# Patient Record
Sex: Male | Born: 1970 | Race: White | Hispanic: No | Marital: Married | Smoking: Never smoker
Health system: Southern US, Community
[De-identification: ages and names within clinical notes are randomized; demographics above are authoritative.]

## PROBLEM LIST (undated history)

## (undated) DIAGNOSIS — M549 Dorsalgia, unspecified: Secondary | ICD-10-CM

## (undated) HISTORY — PX: KNEE SURGERY: SHX244

---

## 2004-05-15 ENCOUNTER — Ambulatory Visit (HOSPITAL_COMMUNITY): Admission: RE | Admit: 2004-05-15 | Discharge: 2004-05-15 | Payer: Self-pay | Admitting: Urology

## 2004-05-16 ENCOUNTER — Ambulatory Visit (HOSPITAL_COMMUNITY): Admission: RE | Admit: 2004-05-16 | Discharge: 2004-05-16 | Payer: Self-pay | Admitting: Urology

## 2011-08-26 ENCOUNTER — Other Ambulatory Visit (HOSPITAL_COMMUNITY): Payer: Self-pay | Admitting: Urology

## 2011-08-26 DIAGNOSIS — N200 Calculus of kidney: Secondary | ICD-10-CM

## 2011-08-28 ENCOUNTER — Encounter (HOSPITAL_COMMUNITY): Payer: Self-pay

## 2011-08-28 ENCOUNTER — Ambulatory Visit (HOSPITAL_COMMUNITY)
Admission: RE | Admit: 2011-08-28 | Discharge: 2011-08-28 | Disposition: A | Discharge status: Home / Self Care | Payer: BC Managed Care – PPO | Source: Ambulatory Visit | Attending: Urology | Admitting: Urology

## 2011-08-28 DIAGNOSIS — N201 Calculus of ureter: Secondary | ICD-10-CM | POA: Insufficient documentation

## 2011-08-28 DIAGNOSIS — R1032 Left lower quadrant pain: Secondary | ICD-10-CM | POA: Insufficient documentation

## 2011-08-28 DIAGNOSIS — N200 Calculus of kidney: Secondary | ICD-10-CM

## 2014-04-15 ENCOUNTER — Encounter (HOSPITAL_COMMUNITY): Payer: Self-pay | Admitting: Emergency Medicine

## 2014-04-15 ENCOUNTER — Emergency Department (HOSPITAL_COMMUNITY)
Admission: EM | Admit: 2014-04-15 | Discharge: 2014-04-15 | Disposition: A | Discharge status: Home / Self Care | Payer: BC Managed Care – PPO | Attending: Emergency Medicine | Admitting: Emergency Medicine

## 2014-04-15 DIAGNOSIS — L259 Unspecified contact dermatitis, unspecified cause: Secondary | ICD-10-CM | POA: Insufficient documentation

## 2014-04-15 DIAGNOSIS — L239 Allergic contact dermatitis, unspecified cause: Secondary | ICD-10-CM

## 2014-04-15 DIAGNOSIS — J029 Acute pharyngitis, unspecified: Secondary | ICD-10-CM | POA: Insufficient documentation

## 2014-04-15 DIAGNOSIS — R110 Nausea: Secondary | ICD-10-CM | POA: Insufficient documentation

## 2014-04-15 HISTORY — DX: Dorsalgia, unspecified: M54.9

## 2014-04-15 MED ORDER — HYDROXYZINE HCL 25 MG PO TABS: 25.0000 mg | ORAL_TABLET | Freq: Four times a day (QID) | ORAL | Status: DC

## 2014-04-15 MED ORDER — FAMOTIDINE 20 MG PO TABS: 20.0000 mg | ORAL_TABLET | Freq: Two times a day (BID) | ORAL | Status: DC

## 2014-04-15 NOTE — ED Provider Notes (Signed)
CSN: [id number]     Arrival date & time 04/15/14  1220 History   First MD Initiated Contact with Patient 04/15/14 1240     Chief Complaint  Patient presents with  . Rash     (Consider location/radiation/quality/duration/timing/severity/associated sxs/prior Treatment) Patient is a 43 y.o. male presenting with rash. The history is provided by the patient.  Rash Location:  Full body Quality: itchiness   Severity:  Moderate Onset quality:  Gradual Duration:  1 week Timing:  Constant Progression:  Worsening Chronicity:  New Relieved by:  Nothing Worsened by:  Heat Ineffective treatments:  Antihistamines and anti-itch cream (prednisone) Associated symptoms: nausea, sore throat and URI   Associated symptoms: no abdominal pain, no diarrhea, no fever, no headaches, no myalgias, no periorbital edema, no throat swelling and not vomiting    Travis Mendez is a 43 y.o. male who presents to the ED with a rash that started about a week ago. He has never had anything like this before. He went to Urgent Care yesterday and was treated with prednisone and Zyrtec. Today the rash seems worse rather than better.   Past Medical History  Diagnosis Date  . Back pain    Past Surgical History  Procedure Laterality Date  . Knee surgery     No family history on file. History  Substance Use Topics  . Smoking status: Never Smoker   . Smokeless tobacco: Not on file  . Alcohol Use: Yes    Review of Systems  Constitutional: Negative for fever and chills.  HENT: Positive for sore throat.   Eyes: Negative for redness.  Respiratory: Positive for cough.   Cardiovascular: Negative for chest pain.  Gastrointestinal: Positive for nausea. Negative for vomiting, abdominal pain and diarrhea.  Genitourinary: Negative.   Musculoskeletal: Negative for myalgias. Back pain: chronic.  Skin: Positive for rash.  Neurological: Negative for headaches.  Psychiatric/Behavioral: Negative for confusion. The patient  is not nervous/anxious.       Allergies  Review of patient's allergies indicates not on file.  Home Medications   Prior to Admission medications   Not on File   BP 137/82  Pulse 84  Temp(Src) 97.9 F (36.6 C) (Oral)  Resp 16  Ht 6\' 1"  (1.854 m)  Wt 220 lb (99.791 kg)  BMI 29.03 kg/m2  SpO2 99% Physical Exam  Nursing note and vitals reviewed. Constitutional: He is oriented to person, place, and time. He appears well-developed and well-nourished. No distress.  HENT:  Head: Normocephalic and atraumatic.  Mouth/Throat: Uvula is midline, oropharynx is clear and moist and mucous membranes are normal.  Eyes: Conjunctivae and EOM are normal. Pupils are equal, round, and reactive to light.  Neck: Neck supple.  Cardiovascular: Normal rate, regular rhythm and normal heart sounds.   Pulmonary/Chest: Effort normal. No respiratory distress. He has no wheezes.  Musculoskeletal: Normal range of motion. He exhibits no edema.  See skin exam  Neurological: He is alert and oriented to person, place, and time. No cranial nerve deficit.  Skin: Skin is warm and dry.  Multiple linier vesicular lesions noted chest, abdomen, arms, sides and lower legs. There are also hive like areas noted. There is not rash in the genital area or there back.    Psychiatric: He has a normal mood and affect. His behavior is normal.    ED Course  Procedures  MDM  43 y.o. male with a rash x 1 week after picking and eating strawberries. He has medication from Urgent Care. Will  add Atarax and Pepcid. He will follow up with the dermatologist as scheduled in 3 days. He will return as needed for worsening symptoms. Stable for discharge without shortness of breath, swelling of the throat or other problems.  Discussed with the patient and all questioned fully answered. He will return if any problems arise.    Medication List         famotidine 20 MG tablet  Commonly known as:  PEPCID  Take 1 tablet (20 mg total) by  mouth 2 (two) times daily.     hydrOXYzine 25 MG tablet  Commonly known as:  ATARAX/VISTARIL  Take 1 tablet (25 mg total) by mouth every 6 (six) hours.           [place] Island[place] M Demarious Kapur, [place] 04/15/14 [phone]

## 2014-04-15 NOTE — ED Notes (Signed)
Rash over body for a week

## 2014-04-15 NOTE — ED Notes (Signed)
Pt with hives to bilateral arms and trunk for a week, seen at Urgent Care 2 days ago and has been taking benadryl and prednisone for it without any improvement, pt only recalls the only thing different is that he picked strawberries last week

## 2014-04-15 NOTE — Discharge Instructions (Signed)

## 2014-04-15 NOTE — ED Provider Notes (Signed)
Medical screening examination/treatment/procedure(s) were performed by non-physician practitioner and as supervising physician I was immediately available for consultation/collaboration.  Flint MelterElliott L Parris Signer, MD 04/15/14 [phone]

## 2016-04-21 ENCOUNTER — Other Ambulatory Visit (HOSPITAL_COMMUNITY): Payer: Self-pay | Admitting: Specialist

## 2016-04-21 DIAGNOSIS — M545 Low back pain: Secondary | ICD-10-CM

## 2017-03-12 ENCOUNTER — Ambulatory Visit (INDEPENDENT_AMBULATORY_CARE_PROVIDER_SITE_OTHER): Payer: Self-pay

## 2017-03-12 ENCOUNTER — Ambulatory Visit (INDEPENDENT_AMBULATORY_CARE_PROVIDER_SITE_OTHER): Payer: BLUE CROSS/BLUE SHIELD | Admitting: Specialist

## 2017-03-12 ENCOUNTER — Encounter (INDEPENDENT_AMBULATORY_CARE_PROVIDER_SITE_OTHER): Payer: Self-pay | Admitting: Specialist

## 2017-03-12 VITALS — BP 122/83 | HR 65 | Ht 72.5 in | Wt 238.0 lb

## 2017-03-12 DIAGNOSIS — M5416 Radiculopathy, lumbar region: Secondary | ICD-10-CM

## 2017-03-12 DIAGNOSIS — M545 Low back pain: Secondary | ICD-10-CM

## 2017-03-12 MED ORDER — HYDROCODONE-ACETAMINOPHEN 5-325 MG PO TABS: 1.0000 | ORAL_TABLET | Freq: Three times a day (TID) | ORAL | 0 refills | Status: DC | PRN

## 2017-03-12 MED ORDER — METHOCARBAMOL 750 MG PO TABS
750.0000 mg | ORAL_TABLET | Freq: Three times a day (TID) | ORAL | 0 refills | Status: DC | PRN
Start: 2017-03-12 — End: 2017-10-01

## 2017-03-12 NOTE — Progress Notes (Addendum)
Office Visit Note   Patient: Travis Mendez           Date of Birth: February 15, 1971           MRN: [id number] Visit Date: 03/12/2017              Requested by: Laverle Hobby, MD [address] [place], [place] [address] PCP: Josefa Half, MD   Assessment & Plan: Visit Diagnoses:  1. Low back pain, unspecified back pain laterality, unspecified chronicity, with sciatica presence unspecified   2. Radiculopathy, lumbar region     Plan: Due to patient's progressive and worsening symptoms we'll schedule lumbar spine MRI to rule out progression of his lumbar HNP.  He also continues to have ongoing weakness in his left lower extremity. 2 chart to help with some relief in the clinic today we did give depo 80 IM right buttock and Toradol 30 IM left buttock injections.  Prescription for Norco and Robaxin. Wil follow-up after completion of his MRI to discuss results and further treatment options.  Follow-Up Instructions: Return in about 2 weeks (around 03/26/2017) for Review of lumbar spine MRI.   Orders:  Orders Placed This Encounter  Procedures  . XR Lumbar Spine 2-3 Views  . MR Lumbar Spine w/o contrast   Meds ordered this encounter  Medications  . HYDROcodone-acetaminophen (NORCO/VICODIN) 5-325 MG tablet    Sig: Take 1 tablet by mouth every 8 (eight) hours as needed for moderate pain.    Dispense:  30 tablet    Refill:  0  . methocarbamol (ROBAXIN) 750 MG tablet    Sig: Take 1 tablet (750 mg total) by mouth every 8 (eight) hours as needed for muscle spasms.    Dispense:  30 tablet    Refill:  0      Procedures: No procedures performed   Clinical Data: No additional findings.   Subjective: Chief Complaint  Patient presents with  . Lower Back - Follow-up, Pain    Last OV 04/10/2016    HPI Patient returns with complaints of increased low back pain and left greater than right lower extremity radiculopathy times a few days. Last seen in the office May 2017. He has  continued to have ongoing pain numbness and tingling into the left lower extremity down to his foot along with feeling of isn't having increased pain in the right side of his low back that extends down into his right hip and thigh.Aggravated with all activity. Finds it difficult to get comfortable. He denies bowel or bladder incontinence. The last lumbar spine MRI that we have in our records is from 11/23/2013. Report read transitional lumbosacral anatomy. Mild to moderate disc extrusion at L5-S1 that displaces the right S1 nerve root. Has had lumbar ESI in the past did not get long-term relief. Review of Systems  Constitutional: Positive for activity change.  HENT: Negative.   Respiratory: Negative.   Cardiovascular: Negative.   Genitourinary: Negative.   Musculoskeletal: Positive for back pain and gait problem.  Skin: Negative.   Neurological: Positive for weakness and numbness.  Psychiatric/Behavioral: Negative.      Objective: Vital Signs: BP 122/83 (BP Location: Left Arm, Patient Position: Sitting)   Pulse 65   Ht 6' 0.5" (1.842 m)   Wt 238 lb (108 kg)   BMI 31.83 kg/m   Physical Exam  Constitutional: He appears well-developed and well-nourished. He appears distressed.  HENT:  Head: Normocephalic and atraumatic.  Eyes: EOM are normal. Pupils are equal, round,  and reactive to light.  Pulmonary/Chest: No respiratory distress.  Abdominal: He exhibits no distension.  Musculoskeletal:  He is antalgic. Decreased lumbar flexion and extension due to pain. Positive right-sided lower lumbar paraspinal tenderness/spasms. Negative logroll bilateral hips. Positive bilateral straight leg raise. He does have trace left anterior tib weakness with resistance. Bilateral calves are nontender.  Skin: Skin is warm and dry.  Psychiatric: He has a normal mood and affect.    Ortho Exam  Specialty Comments:  No specialty comments available.  Imaging: No results found.   PMFS History: There  are no active problems to display for this patient.  Past Medical History:  Diagnosis Date  . Back pain     No family history on file.  Past Surgical History:  Procedure Laterality Date  . KNEE SURGERY     Social History   Occupational History  . Not on file.   Social History Main Topics  . Smoking status: Never Smoker  . Smokeless tobacco: Never Used  . Alcohol use Yes  . Drug use: Unknown  . Sexual activity: Not on file

## 2017-03-18 ENCOUNTER — Ambulatory Visit (HOSPITAL_COMMUNITY)
Admission: RE | Admit: 2017-03-18 | Discharge: 2017-03-18 | Disposition: A | Discharge status: Home / Self Care | Payer: BLUE CROSS/BLUE SHIELD | Source: Ambulatory Visit | Attending: Surgery | Admitting: Surgery

## 2017-03-18 DIAGNOSIS — M48061 Spinal stenosis, lumbar region without neurogenic claudication: Secondary | ICD-10-CM | POA: Insufficient documentation

## 2017-03-18 DIAGNOSIS — M5127 Other intervertebral disc displacement, lumbosacral region: Secondary | ICD-10-CM | POA: Diagnosis not present

## 2017-03-18 DIAGNOSIS — M5416 Radiculopathy, lumbar region: Secondary | ICD-10-CM | POA: Diagnosis present

## 2017-03-18 DIAGNOSIS — M5126 Other intervertebral disc displacement, lumbar region: Secondary | ICD-10-CM | POA: Insufficient documentation

## 2017-03-18 DIAGNOSIS — M4807 Spinal stenosis, lumbosacral region: Secondary | ICD-10-CM | POA: Insufficient documentation

## 2017-04-23 ENCOUNTER — Ambulatory Visit (INDEPENDENT_AMBULATORY_CARE_PROVIDER_SITE_OTHER): Payer: BLUE CROSS/BLUE SHIELD | Admitting: Specialist

## 2017-04-23 ENCOUNTER — Encounter (INDEPENDENT_AMBULATORY_CARE_PROVIDER_SITE_OTHER): Payer: Self-pay | Admitting: Specialist

## 2017-04-23 VITALS — BP 126/87 | HR 64 | Ht 72.5 in | Wt 238.0 lb

## 2017-04-23 DIAGNOSIS — M48061 Spinal stenosis, lumbar region without neurogenic claudication: Secondary | ICD-10-CM

## 2017-04-23 DIAGNOSIS — M5136 Other intervertebral disc degeneration, lumbar region: Secondary | ICD-10-CM

## 2017-04-23 MED ORDER — DICLOFENAC SODIUM 75 MG PO TBEC: 75.0000 mg | DELAYED_RELEASE_TABLET | Freq: Two times a day (BID) | ORAL | 3 refills | Status: DC

## 2017-04-23 NOTE — Patient Instructions (Signed)
Avoid frequent bending and stooping  No lifting greater than 10 lbs. May use ice or moist heat for pain. Weight loss is of benefit. Will send you back for course of PT I will discuss with Dr.Newton if he offers percutaneuous discectomy or IDAC of the disc with laser to decompress the L4-5 and L5-S1 discs,.

## 2017-04-23 NOTE — Progress Notes (Signed)
Office Visit Note   Patient: Travis Mendez           Date of Birth: [birth year]           MRN: [id number] Visit Date: 04/23/2017              Requested by: Laverle Hobby, MD [address] [place], [place] [address] PCP: Laverle Hobby, MD   Assessment & Plan: Visit Diagnoses:  1. Degenerative disc disease, lumbar   2. Spinal stenosis of lumbar region without neurogenic claudication     Plan:Avoid frequent bending and stooping  No lifting greater than 10 lbs. May use ice or moist heat for pain. Weight loss is of benefit. Will send you back for course of PT I will discuss with Dr.Newton if he offers percutaneuous discectomy or IDAC of the disc with laser to decompress the L4-5 and L5-S1 discs,.  Follow-Up Instructions: No Follow-up on file.   Orders:  No orders of the defined types were placed in this encounter.  No orders of the defined types were placed in this encounter.     Procedures: No procedures performed   Clinical Data: No additional findings.   Subjective: Chief Complaint  Patient presents with  . Lower Back - Follow-up    MRI Review Lumbar    46 year old male with history of back pain and radiation into the legs right buttock and right thigh and pain in to the left leg with numbness and paresthesias. Bowel and bladder with increase in frequency. He takes hydrocodone sparingly, also a muscle relaxer. 3-4 tablets in the last 1-2 weeks.  Pain is worse in the back greater than into the legs. Better with lying down. Muscular spasm sometimes is worse and has him down and out. He feels better with walking. He walks quite a bit in his usual job duties. Right now the numbness is into the left dorsal foot towards the 2-3 toes.     Review of Systems  Constitutional: Negative.   HENT: Negative.   Eyes: Negative.   Respiratory: Negative.   Cardiovascular: Negative.   Gastrointestinal: Negative.   Endocrine: Negative.   Genitourinary: Negative.     Musculoskeletal: Negative.   Skin: Negative.   Allergic/Immunologic: Negative.   Neurological: Negative.   Hematological: Negative.   Psychiatric/Behavioral: Negative.      Objective: Vital Signs: BP 126/87 (BP Location: Left Arm, Patient Position: Sitting)   Pulse 64   Ht 6' 0.5" (1.842 m)   Wt 238 lb (108 kg)   BMI 31.83 kg/m   Physical Exam  Back Exam   Tenderness  The patient is experiencing tenderness in the lumbar.  Range of Motion  Extension: abnormal  Flexion: normal  Lateral Bend Right: normal  Lateral Bend Left: normal  Rotation Right: normal  Rotation Left: normal   Muscle Strength  Right Quadriceps:  5/5  Left Quadriceps:  5/5  Right Hamstrings:  5/5  Left Hamstrings:  5/5   Tests  Straight leg raise right: negative Straight leg raise left: negative  Reflexes  Patellar: normal Babinski's sign: normal   Other  Toe Walk: normal Heel Walk: normal Sensation: decreased Gait: normal       Specialty Comments:  No specialty comments available.  Imaging: No results found.   PMFS History: There are no active problems to display for this patient.  Past Medical History:  Diagnosis Date  . Back pain     No family history on file.  Past Surgical  History:  Procedure Laterality Date  . KNEE SURGERY     Social History   Occupational History  . Not on file.   Social History Main Topics  . Smoking status: Never Smoker  . Smokeless tobacco: Never Used  . Alcohol use Yes  . Drug use: Unknown  . Sexual activity: Not on file

## 2017-05-12 ENCOUNTER — Encounter (INDEPENDENT_AMBULATORY_CARE_PROVIDER_SITE_OTHER): Payer: Self-pay | Admitting: *Deleted

## 2017-06-26 ENCOUNTER — Encounter (INDEPENDENT_AMBULATORY_CARE_PROVIDER_SITE_OTHER): Payer: Self-pay | Admitting: Specialist

## 2017-06-26 ENCOUNTER — Ambulatory Visit (INDEPENDENT_AMBULATORY_CARE_PROVIDER_SITE_OTHER): Payer: BLUE CROSS/BLUE SHIELD | Admitting: Specialist

## 2017-06-26 VITALS — BP 128/83 | HR 72 | Ht 72.5 in | Wt 238.0 lb

## 2017-06-26 DIAGNOSIS — M5416 Radiculopathy, lumbar region: Secondary | ICD-10-CM

## 2017-06-26 DIAGNOSIS — Z791 Long term (current) use of non-steroidal anti-inflammatories (NSAID): Secondary | ICD-10-CM

## 2017-06-26 DIAGNOSIS — M5136 Other intervertebral disc degeneration, lumbar region: Secondary | ICD-10-CM | POA: Diagnosis not present

## 2017-06-26 LAB — COMPREHENSIVE METABOLIC PANEL
ALBUMIN: 4.4 g/dL (ref 3.6–5.1)
ALK PHOS: 93 U/L (ref 40–115)
ALT: 30 U/L (ref 9–46)
AST: 20 U/L (ref 10–40)
BILIRUBIN TOTAL: 0.3 mg/dL (ref 0.2–1.2)
BUN: 9 mg/dL (ref 7–25)
CALCIUM: 9.1 mg/dL (ref 8.6–10.3)
CO2: 23 mmol/L (ref 20–31)
Chloride: 104 mmol/L (ref 98–110)
Creat: 0.95 mg/dL (ref 0.60–1.35)
Glucose, Bld: 71 mg/dL (ref 65–99)
POTASSIUM: 4.5 mmol/L (ref 3.5–5.3)
Sodium: 140 mmol/L (ref 135–146)
Total Protein: 6.6 g/dL (ref 6.1–8.1)

## 2017-06-26 MED ORDER — DICLOFENAC SODIUM 75 MG PO TBEC
75.0000 mg | DELAYED_RELEASE_TABLET | Freq: Two times a day (BID) | ORAL | 6 refills | Status: DC
Start: 2017-06-26 — End: 2017-10-01

## 2017-06-26 NOTE — Patient Instructions (Signed)
Plan:Avoid frequent bending and stooping  No lifting greater than 10 lbs. May use ice or moist heat for pain. Weight loss is of benefit. 

## 2017-06-26 NOTE — Progress Notes (Signed)
Office Visit Note   Patient: Travis Mendez           Date of Birth: 1971/02/25           MRN: [id number] Visit Date: 06/26/2017              Requested by: Laverle Hobby, MD [address] [place], [place] [address] PCP: Laverle Hobby, MD   Assessment & Plan: Visit Diagnoses:  1. Degenerative disc disease, lumbar   2. Radiculopathy, lumbar region     Plan: Avoid frequent bending and stooping  No lifting greater than 10 lbs. May use ice or moist heat for pain. Weight loss is of benefit. .    Follow-Up Instructions: No Follow-up on file.   Orders:  No orders of the defined types were placed in this encounter.  No orders of the defined types were placed in this encounter.     Procedures: No procedures performed   Clinical Data: No additional findings.   Subjective: Chief Complaint  Patient presents with  . Lower Back - Follow-up    46 year old male with low back and left leg symptoms. The back pain is much better and he feels as though he is moving better.  Less stiffness. At first he took the medication BID and now once a day and intermittantly. He does use with food. He sees Dr. Wende Crease for primary care in [place].    Review of Systems  Constitutional: Negative.   HENT: Negative.   Eyes: Negative.   Respiratory: Negative.   Cardiovascular: Negative.   Gastrointestinal: Negative.   Endocrine: Negative.   Genitourinary: Negative.   Musculoskeletal: Negative.   Skin: Negative.   Allergic/Immunologic: Negative.   Neurological: Negative.   Hematological: Negative.   Psychiatric/Behavioral: Negative.      Objective: Vital Signs: BP 128/83 (BP Location: Left Arm, Patient Position: Sitting)   Pulse 72   Ht 6' 0.5" (1.842 m)   Wt 238 lb (108 kg)   BMI 31.83 kg/m   Physical Exam  Constitutional: He is oriented to person, place, and time. He appears well-developed and well-nourished.  HENT:  Head: Normocephalic and atraumatic.  Eyes:  Pupils are equal, round, and reactive to light. EOM are normal.  Neck: Normal range of motion. Neck supple.  Pulmonary/Chest: Effort normal and breath sounds normal.  Abdominal: Soft. Bowel sounds are normal.  Musculoskeletal: Normal range of motion.  Neurological: He is alert and oriented to person, place, and time.  Skin: Skin is warm and dry.  Psychiatric: He has a normal mood and affect. His behavior is normal. Judgment and thought content normal.    Back Exam   Tenderness  The patient is experiencing tenderness in the lumbar.  Range of Motion  Extension: normal  Flexion: normal  Lateral Bend Right: normal  Lateral Bend Left: normal  Rotation Right: normal  Rotation Left: normal   Muscle Strength  Right Quadriceps:  5/5  Left Quadriceps:  5/5  Right Hamstrings:  5/5  Left Hamstrings:  5/5   Tests  Straight leg raise right: negative Straight leg raise left: negative  Reflexes  Patellar: normal Achilles: abnormal Babinski's sign: normal   Other  Toe Walk: normal Heel Walk: abnormal Sensation: decreased Gait: normal   Comments:  Tight hamstrings bilat. Normal motor, decreased sensation left S1 chronic.      Specialty Comments:  No specialty comments available.  Imaging: No results found.   PMFS History: There are no active problems to display  for this patient.  Past Medical History:  Diagnosis Date  . Back pain     No family history on file.  Past Surgical History:  Procedure Laterality Date  . KNEE SURGERY     Social History   Occupational History  . Not on file.   Social History Main Topics  . Smoking status: Never Smoker  . Smokeless tobacco: Never Used  . Alcohol use Yes  . Drug use: Unknown  . Sexual activity: Not on file

## 2017-07-01 NOTE — Progress Notes (Signed)
ok 

## 2017-10-01 ENCOUNTER — Ambulatory Visit (INDEPENDENT_AMBULATORY_CARE_PROVIDER_SITE_OTHER): Payer: BLUE CROSS/BLUE SHIELD | Admitting: Surgery

## 2017-10-01 ENCOUNTER — Encounter (INDEPENDENT_AMBULATORY_CARE_PROVIDER_SITE_OTHER): Payer: Self-pay | Admitting: Surgery

## 2017-10-01 VITALS — BP 144/87 | HR 78

## 2017-10-01 DIAGNOSIS — M5416 Radiculopathy, lumbar region: Secondary | ICD-10-CM

## 2017-10-01 MED ORDER — METHOCARBAMOL 500 MG PO TABS
500.0000 mg | ORAL_TABLET | Freq: Three times a day (TID) | ORAL | 0 refills | Status: DC | PRN
Start: 2017-10-01 — End: 2018-02-18

## 2017-10-01 NOTE — Progress Notes (Signed)
   Office Visit Note   Patient: Travis JakschGregory S Lawry           Date of Birth: [birth year]           MRN: [id number] Visit Date: 10/01/2017              Requested by: Laverle HobbyGuarino, Joseph, MD [address] [place], [place] [id number] PCP: Laverle HobbyGuarino, Joseph, MD   Assessment & Plan: Visit Diagnoses:  1. Radiculopathy, lumbar region     Plan: With patient's ongoing symptoms I recommend consult with Dr. Alvester MorinNewton physiatrist to discuss possible treatment options. He will follow-up with us in about 6 weeks for recheck to see what was recommended. I reviewed results of patient's previous MRI scan with him again and he is complaining of left lower extremity radiculopathy but this scan shows that most of the issue is primarily on the right.   Follow-Up Instructions: Return in about 6 weeks (around 11/12/2017) for Dr Otelia Sergeantnitka.   Orders:  Orders Placed This Encounter  Procedures  . Ambulatory referral to Physical Medicine Rehab   Meds ordered this encounter  Medications  . methocarbamol (ROBAXIN) 500 MG tablet    Sig: Take 1 tablet (500 mg total) by mouth every 8 (eight) hours as needed for muscle spasms.    Dispense:  40 tablet    Refill:  0      Procedures: No procedures performed   Clinical Data: No additional findings.   Subjective: Chief Complaint  Patient presents with  . Lower Back - Pain    HPI Patient returns with complaints of back pain and left lower extremity radiculopathy. States that he's had a flare over the last couple weeks. Having conservatively managing with Voltaren. Dr. Otelia SergeantNitka had previously recommended referral to Dr. Alvester MorinNewton to discuss treatment options but patient states he has not seen him. Currently pain worse with bending twisting lifting. Pain in the left leg radiates down towards his calf. No radicular pain on the right side although he does complain of sharp pain in the right low back.  Review of Systems No current complaints of cardiac pulmonary GI GU  issues  Objective: Vital Signs: BP (!) 144/87   Pulse 78   Physical Exam  Constitutional: He is oriented to person, place, and time. He appears well-developed. No distress.  HENT:  Head: Normocephalic and atraumatic.  Eyes: Pupils are equal, round, and reactive to light. EOM are normal.  Pulmonary/Chest: No respiratory distress.  Musculoskeletal:  Patient does have lumbar paraspinal tenderness. Positive Tinel's over the left hip greater bursa. Negative logroll bilateral hips. Low back pain with bilateral straight leg raise. Trace right anterior tib and gastroc weakness. Bilateral calves nontender  Neurological: He is alert and oriented to person, place, and time.    Ortho Exam  Specialty Comments:  No specialty comments available.  Imaging: No results found.   PMFS History: There are no active problems to display for this patient.  Past Medical History:  Diagnosis Date  . Back pain     No family history on file.  Past Surgical History:  Procedure Laterality Date  . KNEE SURGERY     Social History   Occupational History  . Not on file.   Social History Main Topics  . Smoking status: Never Smoker  . Smokeless tobacco: Never Used  . Alcohol use Yes  . Drug use: Unknown  . Sexual activity: Not on file

## 2017-10-13 ENCOUNTER — Ambulatory Visit (INDEPENDENT_AMBULATORY_CARE_PROVIDER_SITE_OTHER): Payer: BLUE CROSS/BLUE SHIELD | Admitting: Physical Medicine and Rehabilitation

## 2017-10-13 ENCOUNTER — Encounter (INDEPENDENT_AMBULATORY_CARE_PROVIDER_SITE_OTHER): Payer: Self-pay | Admitting: Physical Medicine and Rehabilitation

## 2017-10-13 VITALS — BP 125/93 | HR 67

## 2017-10-13 DIAGNOSIS — M5116 Intervertebral disc disorders with radiculopathy, lumbar region: Secondary | ICD-10-CM | POA: Diagnosis not present

## 2017-10-13 DIAGNOSIS — G8929 Other chronic pain: Secondary | ICD-10-CM | POA: Diagnosis not present

## 2017-10-13 DIAGNOSIS — M5136 Other intervertebral disc degeneration, lumbar region: Secondary | ICD-10-CM

## 2017-10-13 DIAGNOSIS — M5416 Radiculopathy, lumbar region: Secondary | ICD-10-CM | POA: Diagnosis not present

## 2017-10-13 DIAGNOSIS — M545 Low back pain, unspecified: Secondary | ICD-10-CM

## 2017-10-13 MED ORDER — DIAZEPAM 5 MG PO TABS: ORAL_TABLET | ORAL | 0 refills | Status: AC

## 2017-10-13 NOTE — Progress Notes (Deleted)
Lower back pain right worse than left. Pain and burning top of left foot. Gets worse after walking. Had injections a few years ago here. Symptoms are similar, but they have gotten worse.

## 2017-10-13 NOTE — Patient Instructions (Signed)

## 2017-10-14 ENCOUNTER — Encounter (INDEPENDENT_AMBULATORY_CARE_PROVIDER_SITE_OTHER): Payer: Self-pay | Admitting: Physical Medicine and Rehabilitation

## 2017-10-14 DIAGNOSIS — M5136 Other intervertebral disc degeneration, lumbar region: Secondary | ICD-10-CM | POA: Insufficient documentation

## 2017-10-14 DIAGNOSIS — M5116 Intervertebral disc disorders with radiculopathy, lumbar region: Secondary | ICD-10-CM | POA: Insufficient documentation

## 2017-10-14 NOTE — Progress Notes (Signed)
[name] - 46 y.o. male MRN [id number]  Date of birth: 06/16/1971  Office Visit Note: Visit Date: 10/13/2017 PCP: Travis Hobby, MD Referred by: Travis Hobby, MD  Subjective: Chief Complaint  Patient presents with  . Lower Back - Pain  . Left Foot - Numbness   HPI: Travis Mendez is a 46 year old gentleman that I last saw in 2015 or 16 and completed L4 transforaminal epidural steroid injection for good relief for what at the time was more of a radicular complaint.  His MRI of the time showed 2 disc herniations 1 at L4-5 and wanted L L5-S1.  These were disc extrusions with out focal compression.  He has been followed more recently by Travis Mendez and Travis Mendez in our office.  He has done fairly well up until just recently with worsening episodes of back pain.  He will get a lot of pain if he sits for a long time and then tries to stand.  He will also get more frequent episodes of severe back pain though really having pretty down and out for 3 or 4 days these occur at random times with movement.  He does not endorse any specific shooting leg pains but he has numbness on the top of the left foot is more of an L5 distribution.  He says this is been ongoing for quite a while but has worsened.  He has tried different pain medications and muscle relaxers without much relief.  He has had therapy in the past but not recently.  He does not really do any back exercises.  He does have a job at work where he does stand to do a lot of movement and using foot pedals.  He has not noted any focal weakness of the left foot at the end of the day seems to be weak and tired.  He is not noted any unintended weight loss and he has no real medical history otherwise.  Prior injection required Valium on the second injection was because of what I feel like is probably somewhat undiagnosed generalized anxiety.  We had a long talk today about back pain and discogenic back pain options in treating this.  He has had no  bowel or bladder difficulty or no real trauma.    Review of Systems  Constitutional: Negative for chills, fever, malaise/fatigue and weight loss.  HENT: Negative for hearing loss and sinus pain.   Eyes: Negative for blurred vision, double vision and photophobia.  Respiratory: Negative for cough and shortness of breath.   Cardiovascular: Negative for chest pain, palpitations and leg swelling.  Gastrointestinal: Negative for abdominal pain, nausea and vomiting.  Genitourinary: Negative for flank pain.  Musculoskeletal: Positive for back pain. Negative for myalgias.  Skin: Negative for itching and rash.  Neurological: Positive for tingling. Negative for tremors, focal weakness and weakness.  Endo/Heme/Allergies: Negative.   Psychiatric/Behavioral: Negative for depression.  All other systems reviewed and are negative.  Otherwise per HPI.  Assessment & Plan: Visit Diagnoses:  1. Lumbar radiculopathy   2. Radiculopathy due to lumbar intervertebral disc disorder   3. Degenerative disc disease, lumbar   4. Chronic bilateral low back pain without sciatica     Plan: Findings:  Chronic worsening severe at times with random exacerbations of low back pain across the lower back and to the hips bilaterally right more than left.  He has this chronic left foot numbness on the top of the foot which I do think is probably from the  L5 nerve root.  We talked about mild nerve damage with prolonged feeling of numbness or tingling.  It has worsened recently however.  We had a long discussion on nerve anatomy and repair.  We discussed the fact that the new MRI that he has has shown some regression of the disc extrusion which we do see over time.  We have also discussed that it does not show any specific nerve compression at this point but there is tightness in the lateral recesses.  We talked about epidural injections in their usefulness.  We talked about facet joint mediated pain which I think he could have a  little bit but it just seems to be more disc related at this point.  We talked about ablation potentially of the gray rami communicans nerve for disc pain.  There is also an option of intradiscal injection.  He is not really a fan of injections but is trying to figure out his best options.  He does not really want lumbar fusion.  He does not have much in the way of radicular complaints other than the tingling in the foot.  At this point the best for start is bilateral L4 transforaminal epidural injection.  He does have a transitional S1 segment which is lumbarized.  Depending on the relief for that we could look at a different path depending on how much relief he gets.  Lastly, we could look at electrodiagnostic study of the left lower limb to see if there is any greater than 50% of this visit (total duration of visit) was spent in counseling and coordination of care discussing above.    Meds & Orders:  Meds ordered this encounter  Medications  . diazepam (VALIUM) 5 MG tablet    Sig: Take 1 by mouth 1 to 2 hours pre-procedure. May repeat if necessary.    Dispense:  2 tablet    Refill:  0   No orders of the defined types were placed in this encounter.   Follow-up: Return for Bilateral L4 transforaminal epidural steroid injection.   Procedures: No procedures performed  No notes on file   Clinical History: MRI LUMBAR SPINE WITHOUT CONTRAST  TECHNIQUE: Multiplanar, multisequence MR imaging of the lumbar spine was performed. No intravenous contrast was administered.  COMPARISON:  MRI of the lumbar spine November 23, 2013  FINDINGS: SEGMENTATION: Following prior counting convention, there is transitional anatomy with lumbarized S1 vertebral body.  ALIGNMENT: Maintenance of the lumbar lordosis. No malalignment.  VERTEBRAE:Vertebral bodies are intact. Similar mild to moderate L3-4 through L5-S1 disc height loss with mild desiccation and mild subacute on chronic discogenic endplate  changes compatible with degenerative discs. Subacute to chronic L4 inferior endplate Schmorl's node. No abnormal bone marrow signal nor acute osseous process. 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  CONUS MEDULLARIS: Conus medullaris terminates at T12-L1 and demonstrates normal morphology and signal characteristics. Cauda equina is normal.  PARASPINAL AND SOFT TISSUES: Included prevertebral and paraspinal soft tissues are normal.  DISC LEVELS:  L1-2 and L2-3: No disc bulge, canal stenosis nor neural foraminal narrowing.  L3-4: Small broad-based disc bulge without canal stenosis or neural foraminal narrowing.  L4-5: Re- demonstration of small RIGHT central disc extrusion with approximately 4 mm contiguous inferior migration. Mild canal stenosis. Mild LEFT neural foraminal narrowing.  L5-S1: Small broad-based disc bulge. 4 x 4 mm RIGHT central disc extrusion with 9 mm of contiguous caudal migration contacts the traversing RIGHT S1  nerve within the lateral recess, decreased in size from prior imaging. No canal stenosis. Minimal neural foraminal narrowing.  IMPRESSION: Partial re- absorption of small L5-S1 disc extrusion which contacts the traversing RIGHT S1 nerve. Stable small L4-5 disc extrusion.  Mild canal stenosis L4-5. Minimal to mild L4-5 and L5-S1 neural foraminal narrowing.   Electronically Signed   By: Awilda Metroourtnay  Bloomer M.D.   On: 03/18/2017 16:03  He reports that  has never smoked. he has never used smokeless tobacco. No results for input(s): HGBA1C, LABURIC in the last 8760 hours.  Objective:  VS:  HT:    WT:   BMI:     BP:(!) 125/93  HR:67bpm  TEMP: ( )  RESP:  Physical Exam  Constitutional: He is oriented to person, place, and time. He appears well-developed and well-nourished. No distress.  HENT:  Head: Normocephalic and atraumatic.  Nose: Nose normal.  Mouth/Throat:  Oropharynx is clear and moist.  Eyes: Conjunctivae are normal. Pupils are equal, round, and reactive to light.  Neck: Normal range of motion. Neck supple.  Cardiovascular: Regular rhythm and intact distal pulses.  Pulmonary/Chest: Effort normal and breath sounds normal.  Abdominal: Soft. He exhibits no distension.  Musculoskeletal: He exhibits no deformity.  The patient ambulates without aid with a normal gait.  They have no pain with hip internal or external rotation.  There is pain with extension rotation of the lumbar spine. They have no pain over the greater trochanters or PSIS.  On manual muscle testing there is 5/5 strength in all muscle groups of the lower extremities bilaterally without deficits.  There are 2+ muscle stretch reflexes at the quadriceps and gastrocnemius.  There is no clonus bilaterally.  Slump test is negative bilaterally.   Neurological: He is alert and oriented to person, place, and time. He exhibits normal muscle tone. Coordination normal.  Skin: Skin is warm. No rash noted.  Psychiatric: He has a normal mood and affect. His behavior is normal.  Nursing note and vitals reviewed.   Ortho Exam Imaging: No results found.  Past Medical/Family/Surgical/Social History: Medications & Allergies reviewed per EMR Patient Active Problem List   Diagnosis Date Noted  . Radiculopathy due to lumbar intervertebral disc disorder 10/14/2017  . Degenerative disc disease, lumbar 10/14/2017   Past Medical History:  Diagnosis Date  . Back pain    History reviewed. No pertinent family history. Past Surgical History:  Procedure Laterality Date  . KNEE SURGERY     Social History   Occupational History  . Not on file  Tobacco Use  . Smoking status: Never Smoker  . Smokeless tobacco: Never Used  Substance and Sexual Activity  . Alcohol use: Yes  . Drug use: Not on file  . Sexual activity: Not on file

## 2017-10-21 ENCOUNTER — Encounter (INDEPENDENT_AMBULATORY_CARE_PROVIDER_SITE_OTHER): Payer: Self-pay | Admitting: Physical Medicine and Rehabilitation

## 2017-10-21 ENCOUNTER — Ambulatory Visit (INDEPENDENT_AMBULATORY_CARE_PROVIDER_SITE_OTHER): Payer: BLUE CROSS/BLUE SHIELD | Admitting: Physical Medicine and Rehabilitation

## 2017-10-21 ENCOUNTER — Ambulatory Visit (INDEPENDENT_AMBULATORY_CARE_PROVIDER_SITE_OTHER): Payer: BLUE CROSS/BLUE SHIELD

## 2017-10-21 VITALS — BP 132/80 | HR 77

## 2017-10-21 DIAGNOSIS — M5136 Other intervertebral disc degeneration, lumbar region: Secondary | ICD-10-CM | POA: Diagnosis not present

## 2017-10-21 DIAGNOSIS — M5116 Intervertebral disc disorders with radiculopathy, lumbar region: Secondary | ICD-10-CM | POA: Diagnosis not present

## 2017-10-21 MED ORDER — BETAMETHASONE SOD PHOS & ACET 6 (3-3) MG/ML IJ SUSP
12.0000 mg | Freq: Once | INTRAMUSCULAR | Status: AC
  Administered 2017-10-21: 12 mg

## 2017-10-21 MED ORDER — LIDOCAINE HCL (PF) 1 % IJ SOLN
2.0000 mL | Freq: Once | INTRAMUSCULAR | Status: AC
  Administered 2017-10-21: 2 mL

## 2017-10-21 NOTE — Patient Instructions (Signed)

## 2017-10-21 NOTE — Progress Notes (Deleted)
Patient comes in today for an injection. Travis Mendez made the referral. Patient states he is having a (3-4/10). Tingling, numbness and burning. 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 Difficult sleeping for about 1 month now. Burning when laying down.

## 2017-10-27 NOTE — Procedures (Signed)
[name] is a 46 year old gentleman with bilateral hip and leg pain with some numbness and tingling.  He comes in today for planned bilateral L5 transforaminal injection.  Please see our prior notes for further details and justification.  He is also been followed by Travis Mendez who sent him to us for evaluation.  Lumbosacral Transforaminal Epidural Steroid Injection - Sub-Pedicular Approach with Fluoroscopic Guidance  Patient: Travis Mendez      Date of Birth: 1971/11/08 MRN: [id number] PCP: Travis Mendez, Joseph, MD      Visit Date: 10/21/2017   Universal Protocol:    Date/Time: 10/21/2017  Consent Given By: the patient  Position: PRONE  Additional Comments: Vital signs were monitored before and after the procedure. Patient was prepped and draped in the usual sterile fashion. The correct patient, procedure, and site was verified.   Injection Procedure Details:  Procedure Site One Meds Administered:  Meds ordered this encounter  Medications  . lidocaine (PF) (XYLOCAINE) 1 % injection 2 mL  . betamethasone acetate-betamethasone sodium phosphate (CELESTONE) injection 12 mg    Laterality: Bilateral  Location/Site:  L5-S1  Needle size: 22 G  Needle type: Spinal  Needle Placement: Transforaminal  Findings:  -Contrast Used: 1 mL iohexol 180 mg iodine/mL   -Comments: Excellent flow of contrast along the nerve and into the epidural space.  Procedure Details: After squaring off the end-plates to get a true AP view, the C-arm was positioned so that an oblique view of the foramen as noted above was visualized. The target area is just inferior to the "nose of the scotty dog" or sub pedicular. The soft tissues overlying this structure were infiltrated with 2-3 ml. of 1% Lidocaine without Epinephrine.  The spinal needle was inserted toward the target using a "trajectory" view along the fluoroscope beam.  Under AP and lateral visualization, the needle was advanced so it did not  puncture dura and was located close the 6 O'Clock position of the pedical in AP tracterory. Biplanar projections were used to confirm position. Aspiration was confirmed to be negative for CSF and/or blood. A 1-2 ml. volume of Isovue-250 was injected and flow of contrast was noted at each level. Radiographs were obtained for documentation purposes.   After attaining the desired flow of contrast documented above, a 0.5 to 1.0 ml test dose of 0.25% Marcaine was injected into each respective transforaminal space.  The patient was observed for 90 seconds post injection.  After no sensory deficits were reported, and normal lower extremity motor function was noted,   the above injectate was administered so that equal amounts of the injectate were placed at each foramen (level) into the transforaminal epidural space.   Additional Comments:  The patient tolerated the procedure well Dressing: Band-Aid    Post-procedure details: Patient was observed during the procedure. Post-procedure instructions were reviewed.  Patient left the clinic in stable condition.

## 2017-11-18 ENCOUNTER — Ambulatory Visit (INDEPENDENT_AMBULATORY_CARE_PROVIDER_SITE_OTHER): Payer: BLUE CROSS/BLUE SHIELD | Admitting: Specialist

## 2017-11-18 ENCOUNTER — Encounter (INDEPENDENT_AMBULATORY_CARE_PROVIDER_SITE_OTHER): Payer: Self-pay | Admitting: Specialist

## 2017-11-18 VITALS — BP 132/76 | HR 59 | Ht 73.0 in | Wt 232.0 lb

## 2017-11-18 DIAGNOSIS — M5126 Other intervertebral disc displacement, lumbar region: Secondary | ICD-10-CM | POA: Diagnosis not present

## 2017-11-18 NOTE — Patient Instructions (Signed)
Avoid bending, stooping and avoid lifting weights greater than 10 lbs. Avoid prolong standing and walking. Avoid frequent bending and stooping  No lifting greater than 10 lbs. May use ice or moist heat for pain. Weight loss is of benefit. Handicap license is approved. 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

## 2017-11-18 NOTE — Progress Notes (Signed)
Office Visit Note   Patient: Travis Mendez           Date of Birth: 1971/01/31           MRN: [id number] Visit Date: 11/18/2017              Requested by: Laverle HobbyGuarino, Joseph, MD [address] [place], [place] [id number] PCP: Laverle HobbyGuarino, Joseph, MD   Assessment & Plan: Visit Diagnoses:  1. Recurrent herniation of lumbar disc   ESI only  Help for a short period with some improved left leg pain. Now 8 months post last MRI does not seem to be getting past the discomfort and impairment in his function due to  This disc irritation at the L4-5 level. He is failing a protracted course of conservative management.  He is performing a home exercise program. Will repeat MRI as he has had a worsening of his symptoms after initial improvent with NSAIDs, Exercise and activity modification.   Plan: Avoid bending, stooping and avoid lifting weights greater than 10 lbs. Avoid prolong standing and walking. Avoid frequent bending and stooping  No lifting greater than 10 lbs. May use ice or moist heat for pain. Weight loss is of benefit. Handicap license is approved. 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  Follow-Up Instructions: Return in about 2 years (around 11/19/2019).   Orders:  No orders of the defined types were placed in this encounter.  No orders of the defined types were placed in this encounter.     Procedures: No procedures performed   Clinical Data: No additional findings.   Subjective: Chief Complaint  Patient presents with  . Lower Back - Follow-up    Patient had Bilateral L5-S1 TF injection     46 year old male with lower back pain and radiation into the legs. He underwent bilateral L5-S1 TF ESIs. The right side continues to really be uncomfortable. 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 His machine places the tire together with all the various materials, each code is different. The assist devices are older 1980's. He has left leg is more painful than the right but the right back pain is greater than right side. No bowel or bladder difficulty. Walking with some difficulty, no handicap sticker, about 1/4 mile hike into the  Facility.    Review of Systems  Constitutional: Negative.   HENT: Negative.   Eyes: Negative.   Respiratory: Negative.   Cardiovascular: Negative.   Gastrointestinal: Negative.   Endocrine: Negative.   Genitourinary: Negative.   Musculoskeletal: Negative.   Skin: Negative.   Allergic/Immunologic: Negative.   Neurological: Negative.   Hematological: Negative.   Psychiatric/Behavioral: Negative.      Objective: Vital Signs: BP 132/76 (BP Location: Left Arm, Patient Position: Sitting)   Pulse (!) 59   Ht 6\' 1"  (1.854 m)   Wt 232 lb (105.2 kg)   BMI 30.61 kg/m   Physical Exam  Constitutional: He is oriented to person, place, and time. He appears well-developed and well-nourished.  HENT:  Head: Normocephalic and atraumatic.  Eyes: EOM are normal. Pupils are equal, round, and reactive to light.  Neck: Normal range of motion. Neck supple.  Pulmonary/Chest: Effort normal and breath sounds normal.  Abdominal: Soft. Bowel sounds are normal.  Neurological: He is alert and  oriented to person, place, and time.  Skin: Skin is warm and dry.  Psychiatric: He has a normal mood and affect. His behavior is normal. Judgment and thought content normal.    Right Hip Exam   Range of Motion  Abduction: normal  Adduction: normal  Extension: abnormal  Flexion: normal  External rotation: normal  Internal rotation: normal   Muscle Strength  Abduction: 5/5  Adduction: 5/5  Flexion: 5/5   Tests  FABER: negative Ober: negative  Other  Erythema: absent Scars: absent Sensation: normal  Comments:  Left EHL is 4/5 right is 5/5 DF 5/5  bilateral Hip abduction 5/5 bilateral.   Back Exam   Tenderness  The patient is experiencing tenderness in the lumbar.  Range of Motion  Extension: abnormal  Flexion: normal  Lateral bend right: normal  Lateral bend left: abnormal  Rotation right: normal  Rotation left: abnormal   Muscle Strength  Right Quadriceps:  5/5  Left Quadriceps:  5/5  Right Hamstrings:  5/5  Left Hamstrings:  5/5   Tests  Straight leg raise right: negative Straight leg raise left: negative  Reflexes  Patellar: normal Achilles: normal Babinski's sign: normal   Other  Toe walk: normal Heel walk: abnormal Sensation: decreased Gait: abnormal  Erythema: no back redness Scars: absent  Comments:  Left EHL      Specialty Comments:  No specialty comments available.  Imaging: No results found.   PMFS History: Patient Active Problem List   Diagnosis Date Noted  . Radiculopathy due to lumbar intervertebral disc disorder 10/14/2017  . Degenerative disc disease, lumbar 10/14/2017   Past Medical History:  Diagnosis Date  . Back pain     History reviewed. No pertinent family history.  Past Surgical History:  Procedure Laterality Date  . KNEE SURGERY     Social History   Occupational History  . Not on file  Tobacco Use  . Smoking status: Never Smoker  . Smokeless tobacco: Never Used  Substance and Sexual Activity  . Alcohol use: Yes  . Drug use: Not on file  . Sexual activity: Not on file

## 2017-11-25 ENCOUNTER — Encounter (INDEPENDENT_AMBULATORY_CARE_PROVIDER_SITE_OTHER): Payer: Self-pay | Admitting: Surgery

## 2017-11-25 ENCOUNTER — Ambulatory Visit (INDEPENDENT_AMBULATORY_CARE_PROVIDER_SITE_OTHER): Payer: BLUE CROSS/BLUE SHIELD | Admitting: Surgery

## 2017-11-25 DIAGNOSIS — G8929 Other chronic pain: Secondary | ICD-10-CM | POA: Diagnosis not present

## 2017-11-25 DIAGNOSIS — M5416 Radiculopathy, lumbar region: Secondary | ICD-10-CM

## 2017-11-25 DIAGNOSIS — M533 Sacrococcygeal disorders, not elsewhere classified: Secondary | ICD-10-CM

## 2017-11-25 MED ORDER — KETOROLAC TROMETHAMINE 30 MG/ML IJ SOLN: 30.0000 mg | Freq: Once | INTRAMUSCULAR | Status: AC

## 2017-11-25 MED ORDER — METHYLPREDNISOLONE ACETATE 80 MG/ML IJ SUSP: 80.0000 mg | Freq: Once | INTRAMUSCULAR | Status: AC

## 2017-11-25 NOTE — Progress Notes (Signed)
Office Visit Note   Patient: Travis JakschGregory S Mendez           Date of Birth: 1971-09-18           MRN: [id number] Visit Date: 11/25/2017              Requested by: Laverle HobbyGuarino, Joseph, MD [address] [place], [place] [id number] PCP: Laverle HobbyGuarino, Joseph, MD   Assessment & Plan: Visit Diagnoses:  1. Radiculopathy, lumbar region   2. Chronic right SI joint pain     Plan: With patient's worsening pain around the right SI joint I will schedule diagnostic/therapeutic SI joint Marcaine/Depo-Medrol injection with Dr. Alvester MorinNewton. Asked patient to pay close attention to how he feels immediately after the injection. Will follow-up with Dr.Nitka as scheduled to review Lumbar MRI. Also offered patient Toradol 30 mg and Depo-Medrol 80 mg IM injections. All questions answered. Patient was given a note taking him out of work the remainder of the week.  Follow-Up Instructions: Return for With Dr. Otelia SergeantNitka as scheduled for lumbar MRI review.   Orders:  Orders Placed This Encounter  Procedures  . Ambulatory referral to Physical Medicine Rehab   Meds ordered this encounter  Medications  . methylPREDNISolone acetate (DEPO-MEDROL) injection 80 mg  . ketorolac (TORADOL) 30 MG/ML injection 30 mg      Procedures: No procedures performed   Clinical Data: No additional findings.   Subjective: Chief Complaint  Patient presents with  . Lower Back - Pain    HPI Patient comes in today with complaints of worsening right-sided low back pain and ongoing left lower extremity radiculopathy. Patient was seen by Dr. Otelia SergeantNitka a week ago and lumbar MRI scan was ordered. Patient states that he's had worsening pain around the right SI joint several days. It is aggravated with ambulation sitting and laying flat on his back. Still denies right lower extremity radiculopathy. He's had recent ESI with Dr. Alvester MorinNewton states that he only had maybe 20% improvement that was temporary. Review of Systems No Current cardiopulmonary GI GU  issues  Objective: Vital Signs: There were no vitals taken for this visit.  Physical Exam  Constitutional: He is oriented to person, place, and time. He appears well-developed. No distress.  HENT:  Head: Normocephalic and atraumatic.  Eyes: Pupils are equal, round, and reactive to light.  Pulmonary/Chest: No respiratory distress.  Musculoskeletal:  Marked tenderness over the right SI joint. Negative logroll bilateral hips. Positive right FABER test. Negative on the left side. Calves are nontender.  Neurological: He is alert and oriented to person, place, and time.  Skin: Skin is warm and dry.  Psychiatric: He has a normal mood and affect.    Ortho Exam  Specialty Comments:  No specialty comments available.  Imaging: No results found.   PMFS History: Patient Active Problem List   Diagnosis Date Noted  . Radiculopathy due to lumbar intervertebral disc disorder 10/14/2017  . Degenerative disc disease, lumbar 10/14/2017   Past Medical History:  Diagnosis Date  . Back pain     History reviewed. No pertinent family history.  Past Surgical History:  Procedure Laterality Date  . KNEE SURGERY     Social History   Occupational History  . Not on file  Tobacco Use  . Smoking status: Never Smoker  . Smokeless tobacco: Never Used  Substance and Sexual Activity  . Alcohol use: Yes  . Drug use: Not on file  . Sexual activity: Not on file

## 2017-11-28 ENCOUNTER — Ambulatory Visit
Admission: RE | Admit: 2017-11-28 | Discharge: 2017-11-28 | Disposition: A | Discharge status: Home / Self Care | Payer: BLUE CROSS/BLUE SHIELD | Source: Ambulatory Visit | Attending: Specialist | Admitting: Specialist

## 2017-11-28 DIAGNOSIS — M5126 Other intervertebral disc displacement, lumbar region: Secondary | ICD-10-CM

## 2017-12-11 ENCOUNTER — Ambulatory Visit (INDEPENDENT_AMBULATORY_CARE_PROVIDER_SITE_OTHER): Payer: Self-pay

## 2017-12-11 ENCOUNTER — Ambulatory Visit (INDEPENDENT_AMBULATORY_CARE_PROVIDER_SITE_OTHER): Payer: BLUE CROSS/BLUE SHIELD | Admitting: Physical Medicine and Rehabilitation

## 2017-12-11 ENCOUNTER — Encounter (INDEPENDENT_AMBULATORY_CARE_PROVIDER_SITE_OTHER): Payer: Self-pay | Admitting: Physical Medicine and Rehabilitation

## 2017-12-11 DIAGNOSIS — M461 Sacroiliitis, not elsewhere classified: Secondary | ICD-10-CM | POA: Diagnosis not present

## 2017-12-11 DIAGNOSIS — M25551 Pain in right hip: Secondary | ICD-10-CM

## 2017-12-11 DIAGNOSIS — M5416 Radiculopathy, lumbar region: Secondary | ICD-10-CM

## 2017-12-11 NOTE — Patient Instructions (Signed)

## 2017-12-11 NOTE — Progress Notes (Signed)
Travis Mendez - 47 y.o. male MRN [id number]  Date of birth: 26-Nov-1971  Office Visit Note: Visit Date: 12/11/2017 PCP: Laverle Hobby, MD Referred by: Laverle Hobby, MD  Subjective: Chief Complaint  Patient presents with  . Lower Back - Pain  . Left Thigh - Pain  . Left Foot - Pain   HPI: Travis Mendez is a 47 year old gentleman that I have seen on a few occasions through Dr. Otelia Sergeant for epidural spinal intervention without much success for his back and hip and leg pain.  He reports left hip and leg and foot pain with numbness.  He has a new MRI which I reviewed with the patient will review with Dr. Otelia Sergeant.  MRI shows continued small right paracentral disc protrusion at L5-S1 as well as central disc extrusion with right lateral recess narrowing at L4-5.  He recently saw Zonia Kief who requested diagnostic sacroiliac joint injection on the right for his dull right hip and buttock pain.  Most of his pain is with prolonged sitting and walking.  Muscle relaxers seem to help a little bit.  Injections have not helped to date.    ROS Otherwise per HPI.  Assessment & Plan: Visit Diagnoses:  1. Sacroiliitis (HCC)   2. Pain in right hip   3. Lumbar radiculopathy     Plan: Findings:  Diagnostic and hopefully therapeutic right sacroiliac joint injection.  Partial arthrogram was obtained successfully.    Meds & Orders: No orders of the defined types were placed in this encounter.   Orders Placed This Encounter  Procedures  . Sacroiliac Joint Inj  . XR C-ARM NO REPORT    Follow-up: Return for Dr. Otelia Sergeant as scheduled.   Procedures: Sacroiliac Joint Inj on 12/11/2017 8:57 AM Indications: pain and diagnostic evaluation Details: 22 G 3.5 in needle, fluoroscopy-guided posterior approach Medications: 2 mL bupivacaine 0.5 %; 80 mg methylPREDNISolone acetate 80 MG/ML Outcome: tolerated well, no immediate complications  There was excellent flow of contrast producing a partial arthrogram of  the sacroiliac joint.  Procedure, treatment alternatives, risks and benefits explained, specific risks discussed. Consent was given by the patient. Immediately prior to procedure a time out was called to verify the correct patient, procedure, equipment, support staff and site/side marked as required. Patient was prepped and draped in the usual sterile fashion.      No notes on file   Clinical History: MRI LUMBAR SPINE WITHOUT CONTRAST  TECHNIQUE: Multiplanar, multisequence MR imaging of the lumbar spine was performed. No intravenous contrast was administered.  COMPARISON:  03/18/2017  FINDINGS: Segmentation:  Standard.  Alignment:  Physiologic.  Vertebrae:  No fracture, evidence of discitis, or bone lesion.  Conus medullaris and cauda equina: Conus extends to the T12 level. Conus and cauda equina appear normal.  Paraspinal and other soft tissues: No paraspinal abnormality.  Disc levels:  Disc spaces: Mild degenerative disc disease and minimal disc height loss with disc desiccation at L3-4 and L4-5. Mild disc desiccation at L2-3.  L1-2: No significant disc bulge. No evidence of neural foraminal stenosis. No central canal stenosis.  L2-3: No significant disc bulge. No evidence of neural foraminal stenosis. No central canal stenosis.  L3-4: Mild broad-based disc bulge. No evidence of neural foraminal stenosis. No central canal stenosis.  L4-5: Central disc extrusion with right lateral recess stenosis. No evidence of neural foraminal stenosis. Mild central canal narrowing.  L5-S1: Small right paracentral disc protrusion with mild mass effect on the right intraspinal L5 nerve root. Mild bilateral  facet arthropathy. No evidence of neural foraminal stenosis. No central canal stenosis.  IMPRESSION: 1. At L5-S1 there is a small right paracentral disc protrusion with mild mass effect on the right intraspinal L5 nerve root. Mild bilateral facet  arthropathy. 2. At L4-5 there is a central disc extrusion with right lateral recess stenosis.   Electronically Signed   By: Elige KoHetal  Patel   On: 11/28/2017 14:02  MRI LUMBAR SPINE WITHOUT CONTRAST 03/18/2017  IMPRESSION: Partial re- absorption of small L5-S1 disc extrusion which contacts the traversing RIGHT S1 nerve. Stable small L4-5 disc extrusion.  Mild canal stenosis L4-5. Minimal to mild L4-5 and L5-S1 neural foraminal narrowing.  He reports that  has never smoked. he has never used smokeless tobacco. No results for input(s): HGBA1C, LABURIC in the last 8760 hours.  Objective:  VS:  HT:    WT:   BMI:     BP:   HR: bpm  TEMP: ( )  RESP:  Physical Exam  Musculoskeletal:  Patient ambulates without aid with good distal strength.  Is a positive Fortin finger sign on the right.    Ortho Exam Imaging: No results found.  Past Medical/Family/Surgical/Social History: Medications & Allergies reviewed per EMR Patient Active Problem List   Diagnosis Date Noted  . Radiculopathy due to lumbar intervertebral disc disorder 10/14/2017  . Degenerative disc disease, lumbar 10/14/2017   Past Medical History:  Diagnosis Date  . Back pain    History reviewed. No pertinent family history. Past Surgical History:  Procedure Laterality Date  . KNEE SURGERY     Social History   Occupational History  . Not on file  Tobacco Use  . Smoking status: Never Smoker  . Smokeless tobacco: Never Used  Substance and Sexual Activity  . Alcohol use: Yes  . Drug use: Not on file  . Sexual activity: Not on file

## 2017-12-11 NOTE — Progress Notes (Deleted)
Pt states pain in lower back that radiates down the left leg and left foot. Pt states pain started 10 yrs ago. Pt states dull burning pain in lower back mostly on the right side with a constant pain and tingling in left foot. Pt states walking, bending, sitting for a long period of time makes it worse, muscle relaxer makes it better. Pt states previous injections did not help much.  -Dye Allergies

## 2017-12-14 MED ORDER — BUPIVACAINE HCL 0.5 % IJ SOLN
2.0000 mL | INTRAMUSCULAR | Status: AC | PRN
  Administered 2017-12-11: 2 mL via INTRA_ARTICULAR

## 2017-12-14 MED ORDER — METHYLPREDNISOLONE ACETATE 80 MG/ML IJ SUSP
80.0000 mg | INTRAMUSCULAR | Status: AC | PRN
  Administered 2017-12-11: 80 mg via INTRA_ARTICULAR

## 2017-12-17 ENCOUNTER — Ambulatory Visit (INDEPENDENT_AMBULATORY_CARE_PROVIDER_SITE_OTHER): Payer: BLUE CROSS/BLUE SHIELD | Admitting: Specialist

## 2017-12-18 ENCOUNTER — Encounter (INDEPENDENT_AMBULATORY_CARE_PROVIDER_SITE_OTHER): Payer: Self-pay | Admitting: Specialist

## 2017-12-18 ENCOUNTER — Ambulatory Visit (INDEPENDENT_AMBULATORY_CARE_PROVIDER_SITE_OTHER): Payer: BLUE CROSS/BLUE SHIELD | Admitting: Specialist

## 2017-12-18 VITALS — BP 125/82 | HR 62 | Ht 73.0 in | Wt 232.0 lb

## 2017-12-18 DIAGNOSIS — M5116 Intervertebral disc disorders with radiculopathy, lumbar region: Secondary | ICD-10-CM | POA: Diagnosis not present

## 2017-12-18 NOTE — Patient Instructions (Signed)
Avoid bending, stooping and avoid lifting weights greater than 10 lbs. Avoid prolong standing and walking. Avoid frequent bending and stooping  No lifting greater than 10 lbs. May use ice or moist heat for pain. Weight loss is of benefit. Handicap license is approved. Dr. [place] BlasNewton's secretary/Assistant will call to arrange for epidural steroid injection  Will try ESI and if this doesn't help then may need to consider intervention.

## 2017-12-18 NOTE — Progress Notes (Signed)
Office Visit Note   Patient: Travis JakschGregory S Mendez           Date of Birth: Jan 13, 1971           MRN: [id number] Visit Date: 12/18/2017              Requested by: Laverle HobbyGuarino, Joseph, MD [address] [place], [place] [id number] PCP: Laverle HobbyGuarino, Joseph, MD   Assessment & Plan: Visit Diagnoses:  1. Lumbar disc herniation with radiculopathy     Plan:Avoid bending, stooping and avoid lifting weights greater than 10 lbs. Avoid prolong standing and walking. Avoid frequent bending and stooping  No lifting greater than 10 lbs. May use ice or moist heat for pain. Weight loss is of benefit. Handicap license is approved. Dr. [place] BlasNewton's secretary/Assistant will call to arrange for epidural steroid injection  Will try ESI and if this doesn't help then may need to consider intervention.  Follow-Up Instructions: No Follow-up on file.   Orders:  No orders of the defined types were placed in this encounter.  No orders of the defined types were placed in this encounter.     Procedures: No procedures performed   Clinical Data: No additional findings.   Subjective: Chief Complaint  Patient presents with  . Lower Back - Follow-up    47 year old male with back and buttock and leg pain, history of previous L5-S1 HNP improved with right SI joint injection. Had a right SI joint injection with improvement in the right hip pain is better post SI joint injection. There is numbness and tingling into the top of the left leg and left foot dorsally. No bowel or bladder difficulty except for a bout of diarrhea earlier this week. He continues to work, missed 3 days right before xmas. Holidays has decreased the stresses. [place] in [place].     Review of Systems  Constitutional: Negative.   HENT: Negative.   Eyes: Negative.   Respiratory: Negative.   Cardiovascular: Negative.   Gastrointestinal: Negative.   Endocrine: Negative.   Genitourinary: Negative.   Musculoskeletal: Negative.   Skin:  Negative.   Allergic/Immunologic: Negative.   Neurological: Negative.   Hematological: Negative.   Psychiatric/Behavioral: Negative.      Objective: Vital Signs: BP 125/82 (BP Location: Left Arm, Patient Position: Sitting)   Pulse 62   Ht 6\' 1"  (1.854 m)   Wt 232 lb (105.2 kg)   BMI 30.61 kg/m   Physical Exam  Constitutional: He is oriented to person, place, and time. He appears well-developed and well-nourished.  HENT:  Head: Normocephalic and atraumatic.  Eyes: EOM are normal. Pupils are equal, round, and reactive to light.  Neck: Normal range of motion. Neck supple.  Pulmonary/Chest: Effort normal and breath sounds normal.  Abdominal: Soft. Bowel sounds are normal.  Neurological: He is alert and oriented to person, place, and time.  Skin: Skin is warm and dry.  Psychiatric: He has a normal mood and affect. His behavior is normal. Judgment and thought content normal.    Back Exam   Tenderness  The patient is experiencing tenderness in the lumbar.  Range of Motion  Extension: normal  Flexion: abnormal  Lateral bend right: normal  Lateral bend left: abnormal  Rotation right: normal  Rotation left: abnormal   Muscle Strength  Right Quadriceps:  5/5  Left Quadriceps:  5/5  Right Hamstrings:  5/5  Left Hamstrings:  5/5   Tests  Straight leg raise right: negative Straight leg raise left: positive  Reflexes  Patellar: normal Achilles: normal Babinski's sign: normal   Other  Toe walk: normal Heel walk: normal Sensation: decreased Gait: normal  Erythema: no back redness Scars: absent      Specialty Comments:  No specialty comments available.  Imaging: No results found.   PMFS History: Patient Active Problem List   Diagnosis Date Noted  . Radiculopathy due to lumbar intervertebral disc disorder 10/14/2017  . Degenerative disc disease, lumbar 10/14/2017   Past Medical History:  Diagnosis Date  . Back pain     No family history on file.    Past Surgical History:  Procedure Laterality Date  . KNEE SURGERY     Social History   Occupational History  . Not on file  Tobacco Use  . Smoking status: Never Smoker  . Smokeless tobacco: Never Used  Substance and Sexual Activity  . Alcohol use: Yes  . Drug use: Not on file  . Sexual activity: Not on file

## 2017-12-31 ENCOUNTER — Ambulatory Visit (INDEPENDENT_AMBULATORY_CARE_PROVIDER_SITE_OTHER): Payer: BLUE CROSS/BLUE SHIELD

## 2017-12-31 ENCOUNTER — Encounter (INDEPENDENT_AMBULATORY_CARE_PROVIDER_SITE_OTHER): Payer: Self-pay | Admitting: Physical Medicine and Rehabilitation

## 2017-12-31 ENCOUNTER — Telehealth (INDEPENDENT_AMBULATORY_CARE_PROVIDER_SITE_OTHER): Payer: Self-pay | Admitting: Specialist

## 2017-12-31 ENCOUNTER — Ambulatory Visit (INDEPENDENT_AMBULATORY_CARE_PROVIDER_SITE_OTHER): Payer: BLUE CROSS/BLUE SHIELD | Admitting: Physical Medicine and Rehabilitation

## 2017-12-31 VITALS — BP 128/86 | HR 64 | Temp 98.3°F

## 2017-12-31 DIAGNOSIS — M5416 Radiculopathy, lumbar region: Secondary | ICD-10-CM | POA: Diagnosis not present

## 2017-12-31 MED ORDER — METHYLPREDNISOLONE ACETATE 80 MG/ML IJ SUSP
80.0000 mg | Freq: Once | INTRAMUSCULAR | Status: AC
  Administered 2017-12-31: 80 mg

## 2017-12-31 NOTE — Telephone Encounter (Signed)
Patient called and needed an updated note for work.Very important because note is through tomorrow.  Please call patient to advise he is not feeling any better

## 2017-12-31 NOTE — Progress Notes (Deleted)
Pt states a sharp pain in lower back on the right side that radiates to through the right lower leg. Pt states pain has gotten worse in the past year. Pt states last injection helped out a lot.  Pt states bending and standing for too long makes pain worse, resting makes pain better. +Driver, -BT, -Dye Allergies.

## 2017-12-31 NOTE — Telephone Encounter (Signed)
Patient called to check status of updated work note.  Cb# is [phone].  Please advise.  Thank you.

## 2017-12-31 NOTE — Telephone Encounter (Signed)
Patient called and needed an updated note for work.Very important because note is through tomorrow.  Please call patient to advise he is not feeling any better----  Patient had injection with Dr. Alvester MorinNewton today (12/31/2017)----Please advise

## 2017-12-31 NOTE — Patient Instructions (Signed)

## 2018-01-01 NOTE — Telephone Encounter (Signed)
Patient called again in reference to his work note.  He needs to talk to someone today.  Thank you. °

## 2018-01-01 NOTE — Procedures (Signed)
Travis Mendez is an interesting 47 year old gentleman with history of disc herniation at L4-5 with lateral recess narrowing on the right as well as small protrusion at L5-S1 also more right-sided.  We have seen on a couple of occasions with left-sided radicular pain through Dr. Otelia SergeantNitka.  Most recently we saw him for increasing right buttock pain diagnostic sacroiliac joint injection which was requested by Zonia KiefJames Owens.  The right sacroiliac joint injection gave him some relief of the buttock pain but he continues to have right side lower back pain that is radiating into the lower leg on the right.  Dr. Otelia SergeantNitka requested left-sided injection but his pain is all on the right at this point seems actually fit more with the MRI.  We are to complete a right L4-5 interlaminar epidural steroid injection.  He will follow-up with Dr. Otelia SergeantNitka.  Lumbar Epidural Steroid Injection - Interlaminar Approach with Fluoroscopic Guidance  Patient: Travis Mendez      Date of Birth: [birth year] MRN: [id number] PCP: Laverle HobbyGuarino, Joseph, MD      Visit Date: 12/31/2017   Universal Protocol:     Consent Given By: the patient  Position: PRONE  Additional Comments: Vital signs were monitored before and after the procedure. Patient was prepped and draped in the usual sterile fashion. The correct patient, procedure, and site was verified.   Injection Procedure Details:  Procedure Site One Meds Administered:  Meds ordered this encounter  Medications  . methylPREDNISolone acetate (DEPO-MEDROL) injection 80 mg     Laterality: Right  Location/Site:  L4-L5  Needle size: 20 G  Needle type: Tuohy  Needle Placement: Paramedian epidural  Findings:   -Comments: Excellent flow of contrast into the epidural space.  Procedure Details: Using a paramedian approach from the side mentioned above, the region overlying the inferior lamina was localized under fluoroscopic visualization and the soft tissues overlying this structure  were infiltrated with 4 ml. of 1% Lidocaine without Epinephrine. The Tuohy needle was inserted into the epidural space using a paramedian approach.   The epidural space was localized using loss of resistance along with lateral and bi-planar fluoroscopic views.  After negative aspirate for air, blood, and CSF, a 2 ml. volume of Isovue-250 was injected into the epidural space and the flow of contrast was observed. Radiographs were obtained for documentation purposes.    The injectate was administered into the level noted above.   Additional Comments:  The patient tolerated the procedure well Dressing: Band-Aid    Post-procedure details: Patient was observed during the procedure. Post-procedure instructions were reviewed.  Patient left the clinic in stable condition.  Pertinent Imaging: MRI LUMBAR SPINE WITHOUT CONTRAST  TECHNIQUE: Multiplanar, multisequence MR imaging of the lumbar spine was performed. No intravenous contrast was administered.  COMPARISON:  03/18/2017  FINDINGS: Segmentation:  Standard.  Alignment:  Physiologic.  Vertebrae:  No fracture, evidence of discitis, or bone lesion.  Conus medullaris and cauda equina: Conus extends to the T12 level. Conus and cauda equina appear normal.  Paraspinal and other soft tissues: No paraspinal abnormality.  Disc levels:  Disc spaces: Mild degenerative disc disease and minimal disc height loss with disc desiccation at L3-4 and L4-5. Mild disc desiccation at L2-3.  L1-2: No significant disc bulge. No evidence of neural foraminal stenosis. No central canal stenosis.  L2-3: No significant disc bulge. No evidence of neural foraminal stenosis. No central canal stenosis.  L3-4: Mild broad-based disc bulge. No evidence of neural foraminal stenosis. No central canal stenosis.  L4-5:  Central disc extrusion with right lateral recess stenosis. No evidence of neural foraminal stenosis. Mild central canal  narrowing.  L5-S1: Small right paracentral disc protrusion with mild mass effect on the right intraspinal L5 nerve root. Mild bilateral facet arthropathy. No evidence of neural foraminal stenosis. No central canal stenosis.  IMPRESSION: 1. At L5-S1 there is a small right paracentral disc protrusion with mild mass effect on the right intraspinal L5 nerve root. Mild bilateral facet arthropathy. 2. At L4-5 there is a central disc extrusion with right lateral recess stenosis.   Electronically Signed   By: Elige Ko   On: 11/28/2017 14:02  MRI LUMBAR SPINE WITHOUT CONTRAST 03/18/2017  IMPRESSION: Partial re- absorption of small L5-S1 disc extrusion which contacts the traversing RIGHT S1 nerve. Stable small L4-5 disc extrusion.  Mild canal stenosis L4-5. Minimal to mild L4-5 and L5-S1 neural foraminal narrowing.

## 2018-01-01 NOTE — Telephone Encounter (Signed)
Patient called again in reference to his work note.  He needs to talk to someone today.  Thank you.

## 2018-01-04 ENCOUNTER — Encounter (INDEPENDENT_AMBULATORY_CARE_PROVIDER_SITE_OTHER): Payer: Self-pay | Admitting: Radiology

## 2018-01-05 ENCOUNTER — Telehealth (INDEPENDENT_AMBULATORY_CARE_PROVIDER_SITE_OTHER): Payer: Self-pay | Admitting: Specialist

## 2018-01-05 NOTE — Telephone Encounter (Signed)
Patient called asked for a call back. Patient advised he did not want a note for 1 day. The number to contact patient is [phone]

## 2018-01-06 NOTE — Telephone Encounter (Signed)
I called and advised patient that the note has him written out of work till his follow up appt on 01/18/2018

## 2018-01-18 ENCOUNTER — Ambulatory Visit (INDEPENDENT_AMBULATORY_CARE_PROVIDER_SITE_OTHER): Payer: BLUE CROSS/BLUE SHIELD | Admitting: Specialist

## 2018-01-18 ENCOUNTER — Encounter (INDEPENDENT_AMBULATORY_CARE_PROVIDER_SITE_OTHER): Payer: Self-pay | Admitting: Specialist

## 2018-01-18 VITALS — BP 139/94 | HR 84 | Ht 73.0 in | Wt 232.0 lb

## 2018-01-18 DIAGNOSIS — M5136 Other intervertebral disc degeneration, lumbar region: Secondary | ICD-10-CM | POA: Diagnosis not present

## 2018-01-18 DIAGNOSIS — M5116 Intervertebral disc disorders with radiculopathy, lumbar region: Secondary | ICD-10-CM

## 2018-01-18 DIAGNOSIS — M5126 Other intervertebral disc displacement, lumbar region: Secondary | ICD-10-CM | POA: Diagnosis not present

## 2018-01-18 NOTE — Patient Instructions (Addendum)
Avoid frequent bending and stooping  No lifting greater than 10 lbs. May use ice or moist heat for pain. Weight loss is of benefit. Handicap license is approved.  Physical therapy at [facility] for Mckenzie exercises and LE strengthening and hamstring stretching. Aerobic exercises.

## 2018-01-18 NOTE — Progress Notes (Signed)
Office Visit Note   Patient: Travis Mendez           Date of Birth: 1971-01-05           MRN: [id number] Visit Date: 01/18/2018              Requested by: Laverle Hobby, MD [address] [place], [place] [address] PCP: Laverle Hobby, MD   Assessment & Plan: Visit Diagnoses:  1. 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 Degenerative disc disease, lumbar   47 year old male with two level L4-5 and L5-S1 central disc protusion and ongoing lumbago with sciatica, worsing with sitting and standing and bending and stooping. Underwent ESIs by Dr. Alvester Morin with some improvement for nearly 2 weeks and now a pain  Plan:Avoid frequent bending and stooping  No lifting greater than 10 lbs. May use ice or moist heat for pain. Weight loss is of benefit. Handicap license is approved.  Physical therapy at [facility] for Mckenzie exercises and LE strengthening and hamstring stretching. Aerobic exercises.  Follow-Up Instructions: Return in about 4 weeks (around 02/15/2018).   Orders:  No orders of the defined types were placed in this encounter.  No orders of the defined types were placed in this encounter.     Procedures: No procedures performed   Clinical Data: No additional findings.   Subjective: Chief Complaint  Patient presents with  . Lower Back - Follow-up    He had Right L4-5 IL injection with Dr. Alvester Morin on 12/31/2017, first couple of weeks was worse but then over last week has been best week in years, but now says over last 2 days he can feel it coming back.    47 year old male with history of DDD with disc protrusions L4-5 and L5-S1. He underwent recent ESIs by Dr. Alvester Morin 12/31/2017 at L4-5. He has has progression of his back pain  Over the last several years. 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 He is out of work since 12/17/2017. He takes nothing right now for pain except robaxin and diclofenac. The injection helped  reliev the pain for about 2 days. I can fee the injection wearing off. The day before the injection was horrible. No bowel or bladder difficulty. He was able to standup straight after the [facility] but he is concerned that the pain is recurring. Left foot dorsally not normal, Some pain into the right thigh and right leg. Feels like the pain is recurring. Sciatica in the right thigh and leg and left foot with numbness and pain to palpation.     Review of Systems  Constitutional: Negative.   HENT: Negative.   Eyes: Negative.   Respiratory: Negative.   Cardiovascular: Negative.   Gastrointestinal: Negative.   Endocrine: Negative.   Genitourinary: Negative.   Musculoskeletal: Negative.   Skin: Negative.   Allergic/Immunologic: Negative.   Neurological: Negative.   Hematological: Negative.   Psychiatric/Behavioral: Negative.      Objective: Vital Signs: BP (!) 139/94 (BP Location: Left Arm, Patient Position: Sitting)   Pulse 84   Ht 6\' 1"  (1.854 m)   Wt 232 lb (105.2 kg)   BMI 30.61 kg/m   Physical Exam  Constitutional: He is oriented to person, place, and time. He appears well-developed and well-nourished.  HENT:  Head: Normocephalic and atraumatic.  Eyes: EOM are normal. Pupils are equal, round, and reactive to light.  Neck: Normal range of motion. Neck  supple.  Pulmonary/Chest: Effort normal and breath sounds normal.  Abdominal: Soft. Bowel sounds are normal.  Neurological: He is alert and oriented to person, place, and time.  Skin: Skin is warm and dry.  Psychiatric: He has a normal mood and affect. His behavior is normal. Judgment and thought content normal.    Back Exam   Tenderness  The patient is experiencing tenderness in the lumbar.  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 Pain with DF and direct pressure over the left instep.      Specialty Comments:  No specialty comments available.  Imaging: No results found.   PMFS History: Patient Active Problem List   Diagnosis Date Noted  . Radiculopathy due to lumbar intervertebral disc disorder 10/14/2017  . Degenerative disc disease, lumbar 10/14/2017   Past Medical History:  Diagnosis Date  . Back pain     History reviewed. No pertinent family history.  Past Surgical History:  Procedure Laterality Date  . KNEE SURGERY     Social History   Occupational History  . Not on file  Tobacco Use  . Smoking status: Never Smoker  . Smokeless tobacco: Never Used  Substance and Sexual Activity  . Alcohol use: Yes  . Drug use: Not on file  . Sexual activity: Not on file

## 2018-01-20 ENCOUNTER — Telehealth (INDEPENDENT_AMBULATORY_CARE_PROVIDER_SITE_OTHER): Payer: Self-pay | Admitting: Specialist

## 2018-01-20 NOTE — Telephone Encounter (Signed)
Lurena JoinerRebecca with [employer] left a voicemail inquiring about an estimated return to work date for patient. Please advise # [phone]

## 2018-01-21 NOTE — Telephone Encounter (Signed)
Travis Mendez with [employer] left a voicemail inquiring about an estimated return to work date for patient.-----When is patient to go back to work?

## 2018-02-18 ENCOUNTER — Encounter (INDEPENDENT_AMBULATORY_CARE_PROVIDER_SITE_OTHER): Payer: Self-pay | Admitting: Specialist

## 2018-02-18 ENCOUNTER — Ambulatory Visit (INDEPENDENT_AMBULATORY_CARE_PROVIDER_SITE_OTHER): Payer: BLUE CROSS/BLUE SHIELD | Admitting: Specialist

## 2018-02-18 VITALS — BP 113/78 | HR 82

## 2018-02-18 DIAGNOSIS — M5126 Other intervertebral disc displacement, lumbar region: Secondary | ICD-10-CM

## 2018-02-18 DIAGNOSIS — M5442 Lumbago with sciatica, left side: Secondary | ICD-10-CM

## 2018-02-18 MED ORDER — METHOCARBAMOL 500 MG PO TABS: 500.0000 mg | ORAL_TABLET | Freq: Three times a day (TID) | ORAL | 0 refills | Status: AC | PRN

## 2018-02-18 NOTE — Patient Instructions (Signed)
Avoid frequent bending and stooping  No lifting greater than 10 lbs. May use ice or moist heat for pain. Weight loss is of benefit.   

## 2018-02-18 NOTE — Progress Notes (Signed)
Office Visit Note   Patient: Travis JakschGregory S Mendez           Date of Birth: 01-29-1971           MRN: [id number] Visit Date: 02/18/2018              Requested by: Laverle HobbyGuarino, Joseph, MD [address] [place], [place] [id number] PCP: Laverle HobbyGuarino, Joseph, MD   Assessment & Plan: Visit Diagnoses:  1. 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 May use ice or moist heat for pain. Weight loss is of benefit.  Follow-Up Instructions: Return in about 4 weeks (around 03/18/2018).   Orders:  No orders of the defined types were placed in this encounter.  No orders of the defined types were placed in this encounter.     Procedures: No procedures performed   Clinical Data: No additional findings.   Subjective: Chief Complaint  Patient presents with  . Lower Back - Follow-up, Pain    47 year old male with history of back pain and sciatica in the right greater than left leg. He went to PT and Relates that the PT decreased the pain by 50%. No bowel or bladder difficulty. He is slated to go back to work next Monday. Gets numbness in the right toes greater than left. Heat right side helps and the pain is decreased 50%. He is okay to try to get back to PT. [facility],  Carney BernWendell is the therapist and he is happy with  Result.    Review of Systems  HENT: Positive for rhinorrhea and sinus pain.   Eyes: Negative.   Respiratory: Positive for cough.   Cardiovascular: Negative.   Gastrointestinal: Negative.   Endocrine: Negative.   Genitourinary: Negative.   Musculoskeletal: Positive for back pain.  Skin: Negative.   Allergic/Immunologic: Negative.   Neurological: Positive for weakness and numbness.  Hematological: Negative.   Psychiatric/Behavioral: Negative.      Objective: Vital Signs: BP 113/78   Pulse 82   Physical Exam  Constitutional: He is oriented to person, place,  and time. He appears well-developed and well-nourished.  HENT:  Head: Normocephalic and atraumatic.  Eyes: EOM are normal. Pupils are equal, round, and reactive to light.  Neck: Normal range of motion. Neck supple.  Pulmonary/Chest: Effort normal and breath sounds normal.  Abdominal: Soft. Bowel sounds are normal.  Neurological: He is alert and oriented to person, place, and time.  Skin: Skin is warm and dry.  Psychiatric: He has a normal mood and affect. His behavior is normal. Judgment and thought content normal.    Back Exam   Tenderness  The patient is experiencing tenderness in the lumbar.  Range of Motion  Extension: abnormal  Flexion: abnormal  Lateral bend right: abnormal  Lateral bend left: abnormal  Rotation right: abnormal  Rotation left: abnormal   Muscle Strength  Right Quadriceps:  5/5  Left Quadriceps:  5/5  Right Hamstrings:  5/5  Left Hamstrings:  5/5   Tests  Straight leg raise right: negative Straight leg raise left: negative  Reflexes  Patellar: normal Achilles: normal Babinski's sign: normal   Other  Toe walk: normal Heel walk: normal Sensation: normal Gait: normal       Specialty Comments:  No specialty comments available.  Imaging: No results found.   PMFS History: Patient Active Problem List   Diagnosis  Date Noted  . Radiculopathy due to lumbar intervertebral disc disorder 10/14/2017  . Degenerative disc disease, lumbar 10/14/2017   Past Medical History:  Diagnosis Date  . Back pain     History reviewed. No pertinent family history.  Past Surgical History:  Procedure Laterality Date  . KNEE SURGERY     Social History   Occupational History  . Not on file  Tobacco Use  . Smoking status: Never Smoker  . Smokeless tobacco: Never Used  Substance and Sexual Activity  . Alcohol use: Yes  . Drug use: Not on file  . Sexual activity: Not on file

## 2018-02-18 NOTE — Addendum Note (Signed)
Addended by: Vira BrownsNITKA, JAMES on: 02/18/2018 03:39 PM   Modules accepted: Orders

## 2018-02-22 ENCOUNTER — Encounter (INDEPENDENT_AMBULATORY_CARE_PROVIDER_SITE_OTHER): Payer: Self-pay | Admitting: Specialist

## 2018-02-22 NOTE — Telephone Encounter (Signed)
Patient called to request a note as well to release him back to work. He needs this emailed to him [email or web address]

## 2018-02-23 ENCOUNTER — Encounter (INDEPENDENT_AMBULATORY_CARE_PROVIDER_SITE_OTHER): Payer: Self-pay | Admitting: Specialist

## 2018-03-24 ENCOUNTER — Ambulatory Visit (INDEPENDENT_AMBULATORY_CARE_PROVIDER_SITE_OTHER): Payer: BLUE CROSS/BLUE SHIELD | Admitting: Surgery

## 2018-06-25 ENCOUNTER — Ambulatory Visit (INDEPENDENT_AMBULATORY_CARE_PROVIDER_SITE_OTHER): Payer: BLUE CROSS/BLUE SHIELD | Admitting: Specialist

## 2018-11-09 IMAGING — MR MR LUMBAR SPINE W/O CM
4 of 5 series · 15 of 48 positions shown · non-contrast
Comparison: MRI of the lumbar spine November 23, 2013

CLINICAL DATA: Low back pain radiating to bilateral lower
extremities for 15 years.

EXAM:
MRI LUMBAR SPINE WITHOUT CONTRAST
TECHNIQUE: Multiplanar, multisequence MR imaging of the lumbar spine was
performed. No intravenous contrast was administered.

[Series 3: T2 · sagittal · 4.0mm · 0.73mm/px · 6 of 15 slices shown (1 of 2)]
[im 1/15]
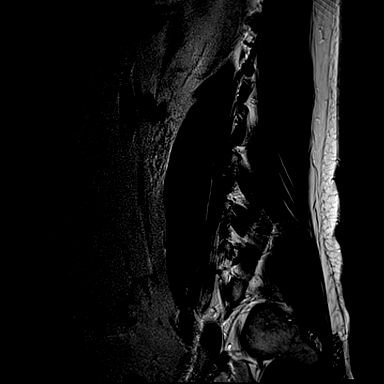
[im 3/15]
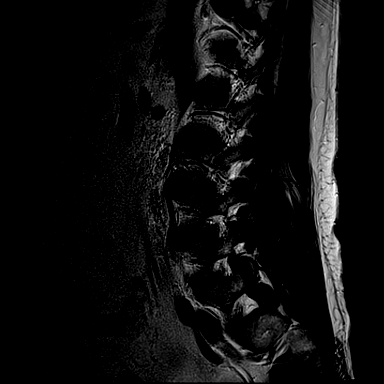
[im 6/15]
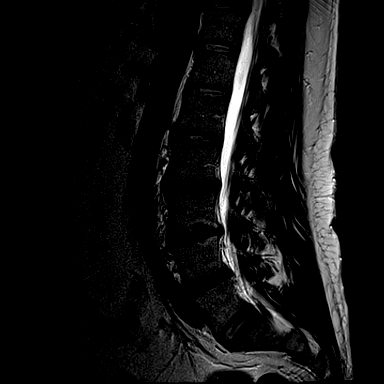
[im 9/15]
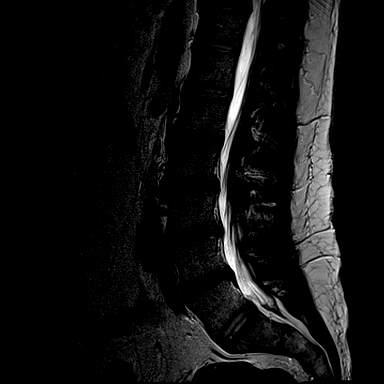
[im 12/15]
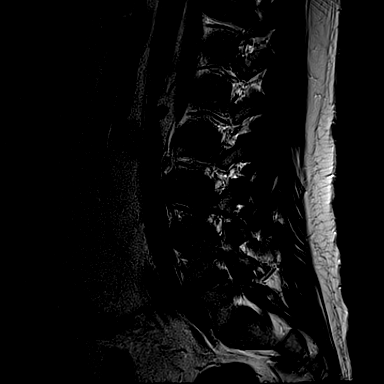
[im 15/15]
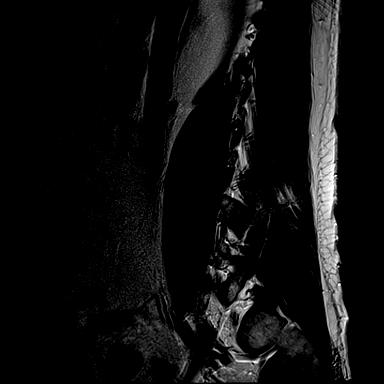

[Series 4: T1 · sagittal · 4.0mm · 0.36mm/px · 3 of 15 slices shown (1 of 2)]
[im 3/15]
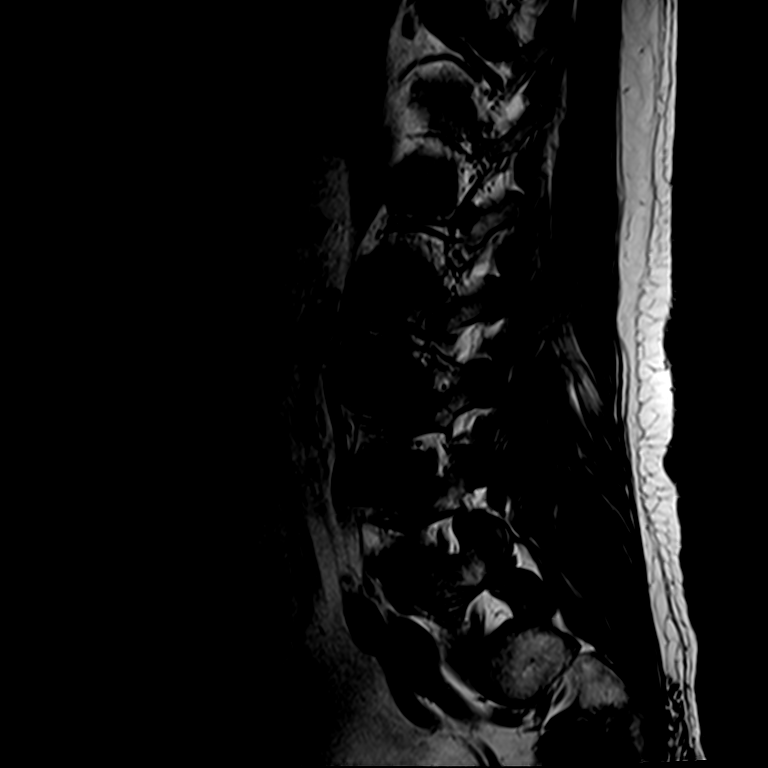
[im 9/15]
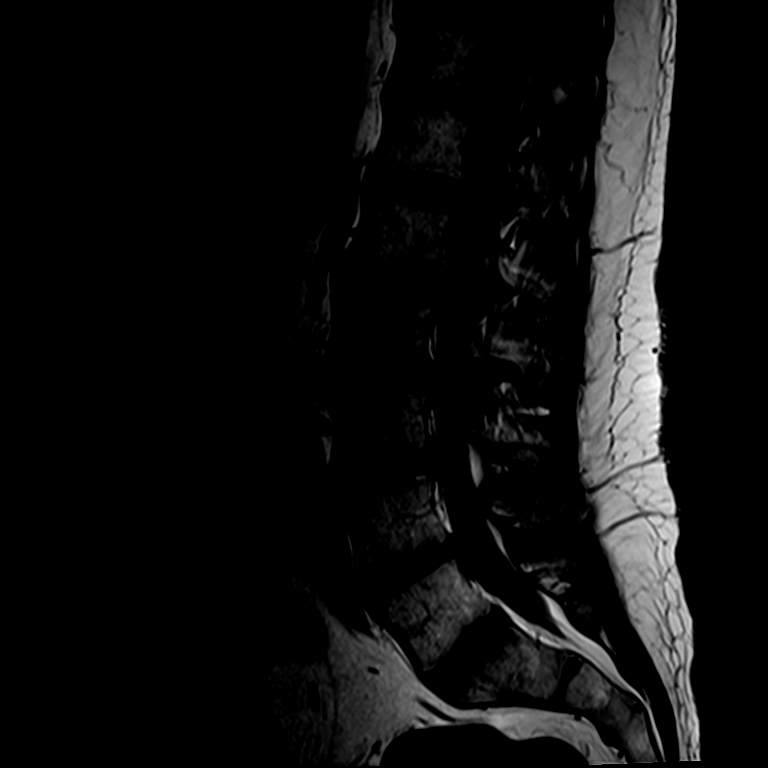
[im 15/15]
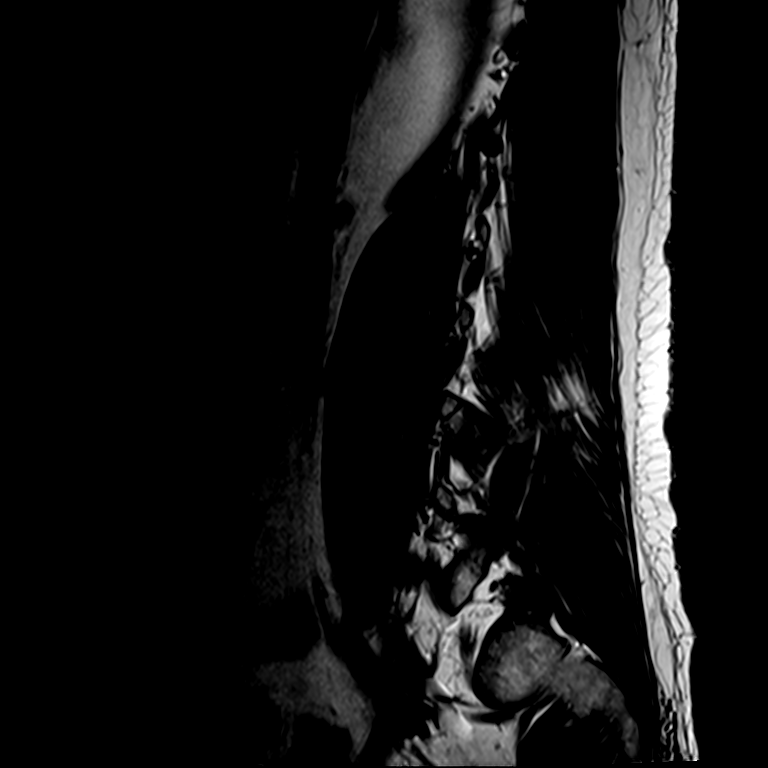

[Series 6: T2 · axial · 4.0mm · 0.22mm/px · z∈[-47,+86]mm · 3 of 38 slices shown (2 of 2)]
[im 6/38]
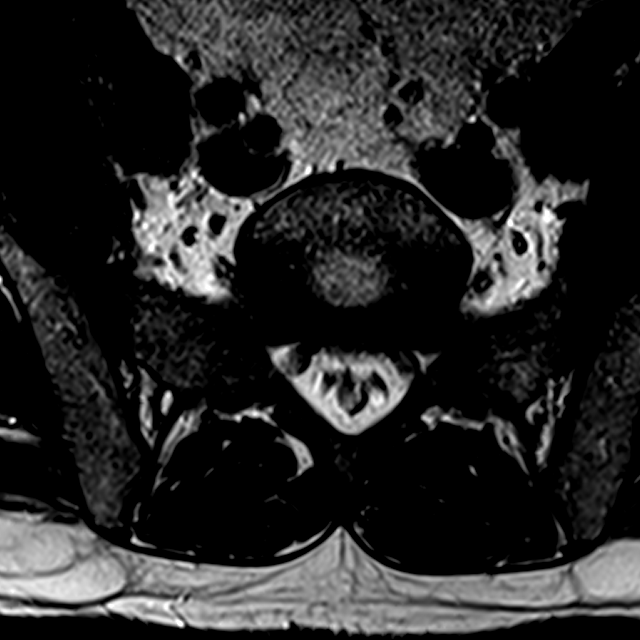
[im 19/38]
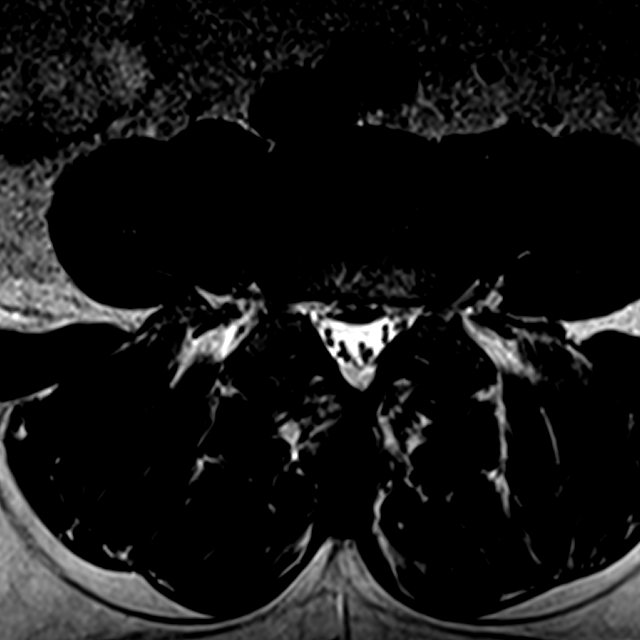
[im 32/38]
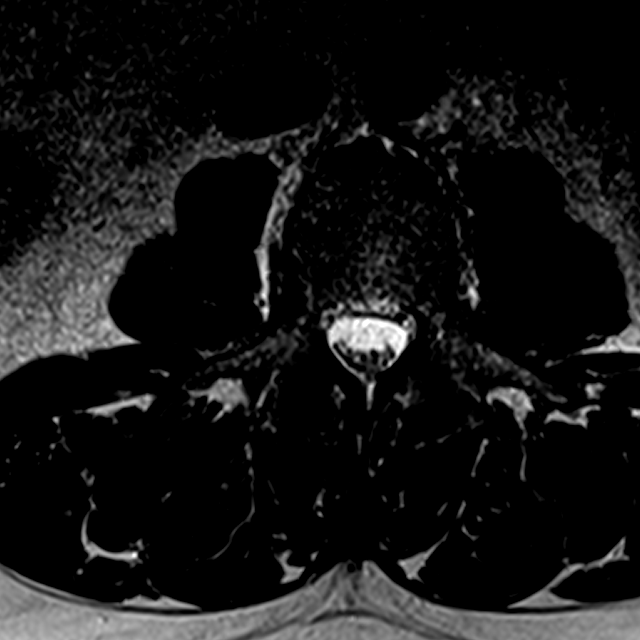

[Series 7: T1 · axial · 4.0mm · 0.22mm/px · z∈[-47,+86]mm · 3 of 38 slices shown (2 of 2)]
[im 6/38]
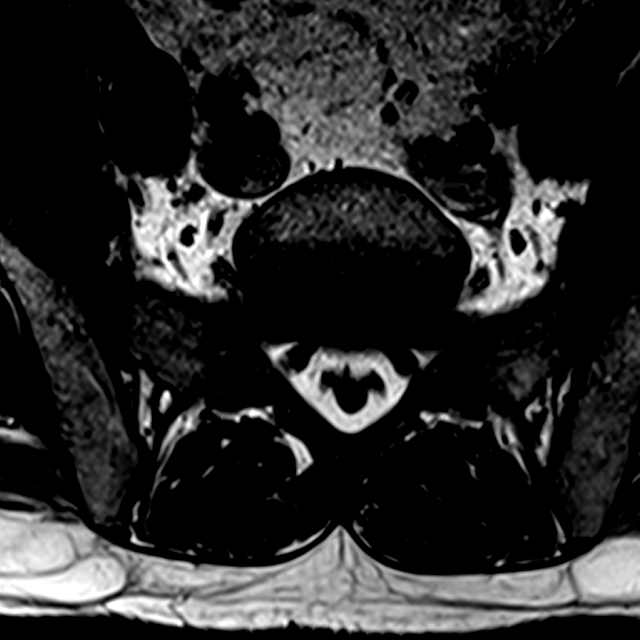
[im 19/38]
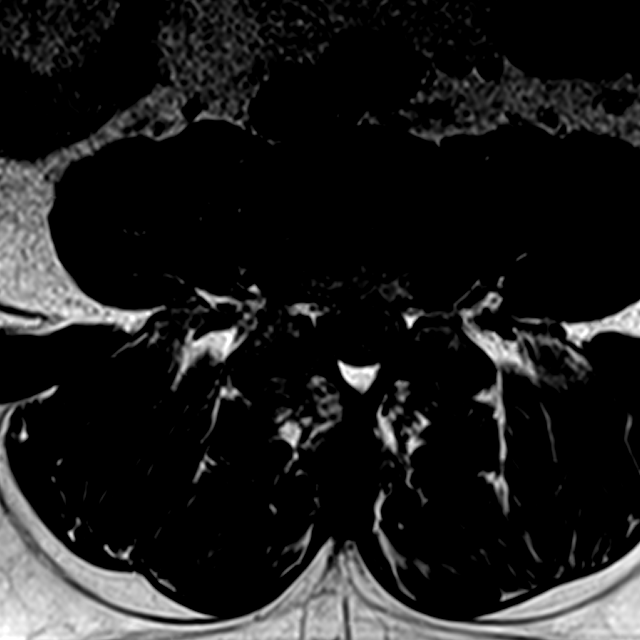
[im 32/38]
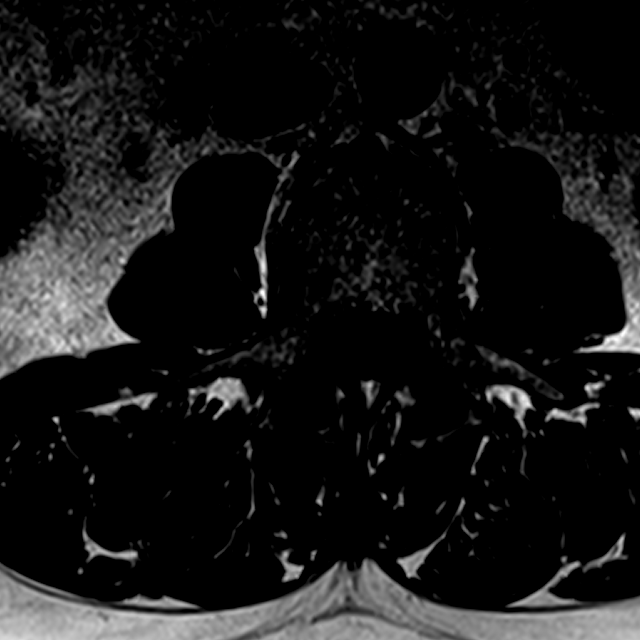

[15 of 48 positions shown; findings below may reference images not displayed]

FINDINGS: SEGMENTATION: Following prior counting convention, there is
transitional anatomy with lumbarized S1 vertebral body.

ALIGNMENT: Maintenance of the lumbar lordosis. No malalignment.

VERTEBRAE:Vertebral bodies are intact. Similar mild to moderate L3-4
through L5-S1 disc height loss with mild desiccation and mild
subacute on chronic discogenic endplate changes compatible with
degenerative discs. Subacute to chronic L4 inferior endplate
Schmorl's node. No abnormal bone marrow signal nor acute osseous
process. Borderline congenital canal narrowing of the mid lumbar
spine on the basis of foreshortened pedicles.

CONUS MEDULLARIS: Conus medullaris terminates at T12-L1 and
demonstrates normal morphology and signal characteristics. Cauda
equina is normal.

PARASPINAL AND SOFT TISSUES: Included prevertebral and paraspinal
soft tissues are normal.

DISC LEVELS:

L1-2 and L2-3: No disc bulge, canal stenosis nor neural foraminal
narrowing.

L3-4: Small broad-based disc bulge without canal stenosis or neural
foraminal narrowing.

L4-5: Re- demonstration of small RIGHT central disc extrusion with
approximately 4 mm contiguous inferior migration. Mild canal
stenosis. Mild LEFT neural foraminal narrowing.

L5-S1: Small broad-based disc bulge. 4 x 4 mm RIGHT central disc
extrusion with 9 mm of contiguous caudal migration contacts the
traversing RIGHT S1 nerve within the lateral recess, decreased in
size from prior imaging. No canal stenosis. Minimal neural foraminal
narrowing.
IMPRESSION: Partial re- absorption of small L5-S1 disc extrusion which contacts
the traversing RIGHT S1 nerve. Stable small L4-5 disc extrusion.

Mild canal stenosis L4-5. Minimal to mild L4-5 and L5-S1 neural
foraminal narrowing.
